# Patient Record
Sex: Female | Born: 1975 | Race: White | Hispanic: Yes | Marital: Single | Smoking: Former smoker
Health system: Southern US, Community
[De-identification: ages and names within clinical notes are randomized; demographics above are authoritative.]

## PROBLEM LIST (undated history)

## (undated) DIAGNOSIS — Z8619 Personal history of other infectious and parasitic diseases: Secondary | ICD-10-CM

## (undated) DIAGNOSIS — T7840XA Allergy, unspecified, initial encounter: Secondary | ICD-10-CM

## (undated) DIAGNOSIS — Z87440 Personal history of urinary (tract) infections: Secondary | ICD-10-CM

## (undated) DIAGNOSIS — M1990 Unspecified osteoarthritis, unspecified site: Secondary | ICD-10-CM

## (undated) HISTORY — PX: FRACTURE SURGERY: SHX138

## (undated) HISTORY — DX: Personal history of other infectious and parasitic diseases: Z86.19

## (undated) HISTORY — DX: Personal history of urinary (tract) infections: Z87.440

## (undated) HISTORY — DX: Unspecified osteoarthritis, unspecified site: M19.90

## (undated) HISTORY — DX: Allergy, unspecified, initial encounter: T78.40XA

## (undated) HISTORY — PX: EYE SURGERY: SHX253

---

## 1993-06-20 HISTORY — PX: CHOLECYSTECTOMY: SHX55

## 1999-12-01 ENCOUNTER — Other Ambulatory Visit: Admission: RE | Admit: 1999-12-01 | Discharge: 1999-12-01 | Payer: Self-pay | Admitting: Family Medicine

## 2000-12-29 ENCOUNTER — Other Ambulatory Visit: Admission: RE | Admit: 2000-12-29 | Discharge: 2000-12-29 | Payer: Self-pay | Admitting: Family Medicine

## 2003-05-09 ENCOUNTER — Other Ambulatory Visit: Admission: RE | Admit: 2003-05-09 | Discharge: 2003-05-09 | Payer: Self-pay | Admitting: Family Medicine

## 2004-08-18 ENCOUNTER — Ambulatory Visit: Payer: Self-pay | Admitting: Family Medicine

## 2007-03-24 ENCOUNTER — Emergency Department (HOSPITAL_COMMUNITY): Admission: EM | Admit: 2007-03-24 | Discharge: 2007-03-25 | Payer: Self-pay | Admitting: Emergency Medicine

## 2007-06-21 HISTORY — PX: RESECTION DISTAL CLAVICAL: SHX5053

## 2007-06-28 ENCOUNTER — Ambulatory Visit (HOSPITAL_BASED_OUTPATIENT_CLINIC_OR_DEPARTMENT_OTHER): Admission: RE | Admit: 2007-06-28 | Discharge: 2007-06-28 | Payer: Self-pay | Admitting: Orthopedic Surgery

## 2007-11-21 DIAGNOSIS — Z87891 Personal history of nicotine dependence: Secondary | ICD-10-CM | POA: Insufficient documentation

## 2007-11-22 ENCOUNTER — Ambulatory Visit: Payer: Self-pay | Admitting: Family Medicine

## 2007-11-22 DIAGNOSIS — J309 Allergic rhinitis, unspecified: Secondary | ICD-10-CM | POA: Insufficient documentation

## 2008-03-04 ENCOUNTER — Other Ambulatory Visit: Admission: RE | Admit: 2008-03-04 | Discharge: 2008-03-04 | Payer: Self-pay | Admitting: Family Medicine

## 2008-03-04 ENCOUNTER — Encounter: Payer: Self-pay | Admitting: Family Medicine

## 2008-03-04 ENCOUNTER — Ambulatory Visit: Payer: Self-pay | Admitting: Family Medicine

## 2008-03-04 DIAGNOSIS — B351 Tinea unguium: Secondary | ICD-10-CM | POA: Insufficient documentation

## 2008-03-05 LAB — CONVERTED CEMR LAB
ALT: 16 units/L (ref 0–35)
AST: 21 units/L (ref 0–37)
Albumin: 4.1 g/dL (ref 3.5–5.2)
Alkaline Phosphatase: 47 units/L (ref 39–117)
BUN: 15 mg/dL (ref 6–23)
Basophils Absolute: 0 10*3/uL (ref 0.0–0.1)
Basophils Relative: 0.8 % (ref 0.0–3.0)
Bilirubin, Direct: 0.1 mg/dL (ref 0.0–0.3)
CO2: 30 meq/L (ref 19–32)
Calcium: 9.2 mg/dL (ref 8.4–10.5)
Chloride: 107 meq/L (ref 96–112)
Cholesterol: 161 mg/dL (ref 0–200)
Creatinine, Ser: 0.7 mg/dL (ref 0.4–1.2)
Eosinophils Absolute: 0.2 10*3/uL (ref 0.0–0.7)
Eosinophils Relative: 3.9 % (ref 0.0–5.0)
GFR calc Af Amer: 123 mL/min
GFR calc non Af Amer: 102 mL/min
Glucose, Bld: 80 mg/dL (ref 70–99)
HCT: 37.9 % (ref 36.0–46.0)
HDL: 74.7 mg/dL (ref >39.0)
Hemoglobin: 13.3 g/dL (ref 12.0–15.0)
LDL Cholesterol: 72 mg/dL (ref 0–99)
Lymphocytes Relative: 39.2 % (ref 12.0–46.0)
MCHC: 35.1 g/dL (ref 30.0–36.0)
MCV: 91.2 fL (ref 78.0–100.0)
Monocytes Absolute: 0.6 10*3/uL (ref 0.1–1.0)
Monocytes Relative: 11.5 % (ref 3.0–12.0)
Neutro Abs: 2.3 10*3/uL (ref 1.4–7.7)
Neutrophils Relative %: 44.6 % (ref 43.0–77.0)
Platelets: 265 10*3/uL (ref 150–400)
Potassium: 4.2 meq/L (ref 3.5–5.1)
RBC: 4.15 M/uL (ref 3.87–5.11)
RDW: 12 % (ref 11.5–14.6)
Sodium: 139 meq/L (ref 135–145)
TSH: 1.6 microintl units/mL (ref 0.35–5.50)
Total Bilirubin: 0.8 mg/dL (ref 0.3–1.2)
Total CHOL/HDL Ratio: 2.2
Total Protein: 7.7 g/dL (ref 6.0–8.3)
Triglycerides: 71 mg/dL (ref 0–149)
VLDL: 14 mg/dL (ref 0–40)
WBC: 5.1 10*3/uL (ref 4.5–10.5)

## 2008-08-21 ENCOUNTER — Telehealth: Payer: Self-pay | Admitting: Family Medicine

## 2008-12-02 ENCOUNTER — Ambulatory Visit: Payer: Self-pay | Admitting: Family Medicine

## 2008-12-02 DIAGNOSIS — F411 Generalized anxiety disorder: Secondary | ICD-10-CM | POA: Insufficient documentation

## 2009-01-13 ENCOUNTER — Ambulatory Visit: Payer: Self-pay | Admitting: Family Medicine

## 2010-02-10 ENCOUNTER — Ambulatory Visit: Payer: Self-pay | Admitting: Family Medicine

## 2010-03-01 ENCOUNTER — Telehealth: Payer: Self-pay | Admitting: Family Medicine

## 2010-03-11 ENCOUNTER — Telehealth: Payer: Self-pay | Admitting: Family Medicine

## 2010-04-20 ENCOUNTER — Other Ambulatory Visit: Admission: RE | Admit: 2010-04-20 | Discharge: 2010-04-20 | Payer: Self-pay | Admitting: Family Medicine

## 2010-04-20 ENCOUNTER — Ambulatory Visit: Payer: Self-pay | Admitting: Family Medicine

## 2010-04-21 LAB — CONVERTED CEMR LAB
ALT: 15 units/L (ref 0–35)
AST: 19 units/L (ref 0–37)
Albumin: 4 g/dL (ref 3.5–5.2)
Alkaline Phosphatase: 48 units/L (ref 39–117)
BUN: 14 mg/dL (ref 6–23)
Basophils Absolute: 0 10*3/uL (ref 0.0–0.1)
Basophils Relative: 0.7 % (ref 0.0–3.0)
Bilirubin, Direct: 0.1 mg/dL (ref 0.0–0.3)
CO2: 28 meq/L (ref 19–32)
Calcium: 9.2 mg/dL (ref 8.4–10.5)
Chloride: 106 meq/L (ref 96–112)
Cholesterol: 139 mg/dL (ref 0–200)
Creatinine, Ser: 0.8 mg/dL (ref 0.4–1.2)
Eosinophils Absolute: 0.2 10*3/uL (ref 0.0–0.7)
Eosinophils Relative: 3.6 % (ref 0.0–5.0)
GFR calc non Af Amer: 82.32 mL/min (ref >60)
Glucose, Bld: 84 mg/dL (ref 70–99)
HCT: 39.2 % (ref 36.0–46.0)
HDL: 55 mg/dL (ref >39.00)
Hemoglobin: 13.7 g/dL (ref 12.0–15.0)
LDL Cholesterol: 72 mg/dL (ref 0–99)
Lymphocytes Relative: 31.7 % (ref 12.0–46.0)
Lymphs Abs: 1.9 10*3/uL (ref 0.7–4.0)
MCHC: 34.9 g/dL (ref 30.0–36.0)
MCV: 92.8 fL (ref 78.0–100.0)
Monocytes Absolute: 0.6 10*3/uL (ref 0.1–1.0)
Monocytes Relative: 10.7 % (ref 3.0–12.0)
Neutro Abs: 3.2 10*3/uL (ref 1.4–7.7)
Neutrophils Relative %: 53.3 % (ref 43.0–77.0)
Platelets: 261 10*3/uL (ref 150.0–400.0)
Potassium: 4.5 meq/L (ref 3.5–5.1)
RBC: 4.23 M/uL (ref 3.87–5.11)
RDW: 13.1 % (ref 11.5–14.6)
Sodium: 139 meq/L (ref 135–145)
TSH: 1.38 microintl units/mL (ref 0.35–5.50)
Total Bilirubin: 0.9 mg/dL (ref 0.3–1.2)
Total CHOL/HDL Ratio: 3
Total Protein: 7.1 g/dL (ref 6.0–8.3)
Triglycerides: 60 mg/dL (ref 0.0–149.0)
VLDL: 12 mg/dL (ref 0.0–40.0)
WBC: 6 10*3/uL (ref 4.5–10.5)

## 2010-04-28 LAB — CONVERTED CEMR LAB: Pap Smear: NEGATIVE

## 2010-07-20 NOTE — Progress Notes (Signed)
Summary: problems weaning off zoloft  Phone Note Call from Patient Call back at Home Phone [phone]   Caller: Patient Call For: Judith Part MD Summary of Call: Pt was told to wean off zoloft by taking one half pill a day for 2 weeks and then stop.  She says this is causing vertigo and she is asking if there is a slower way that she can wean off. Initial call taken by: Lowella Petties CMA,  March 01, 2010 4:27 PM  Follow-up for Phone Call        if she can cut the half "in half"-- take 3/4 pill for 2 weeks and then 1/2 for 2 weeks and then stop  try that and let me know if unable to split the pill that small I will px some 25 mgs Follow-up by: Judith Part MD,  March 01, 2010 4:45 PM  Additional Follow-up for Phone Call Additional follow up Details #1::        Left message for patient to call back on pt's cell. It is after 5pm and pt has already left work. Tried pt's cell again still no answer. Pt will call back on 03/02/10.Lewanda Rife LPN  March 01, 2010 5:06 PM   Patient notified as instructed by telephone. Pt said she could cut the pill in 1/2. Pt said she may run short on pills and if so she will have pharmacy call for refill.Lewanda Rife LPN  March 01, 2010 5:34 PM

## 2010-07-20 NOTE — Progress Notes (Signed)
Summary: wants to stay on zoloft for awhile  Phone Note Call from Patient   Caller: Patient Call For: Judith Part MD Summary of Call: Pt thinks that this is a bad time for her to try and stop zoloft.  She wants to continue to take it and try to stop again in about 6 months.  She will need script with refills sent to [facility]. Initial call taken by: Lowella Petties CMA,  March 11, 2010 11:00 AM  Follow-up for Phone Call        no problem px written on EMR for call in  Follow-up by: Judith Part MD,  March 11, 2010 12:47 PM  Additional Follow-up for Phone Call Additional follow up Details #1::        Medication phoned to [facility] [place] [facility] as instructed. Left message for patient to call back. Lewanda Rife LPN  March 11, 2010 2:48 PM   Advised pt that script has been called in. Additional Follow-up by: Lowella Petties CMA,  March 11, 2010 3:58 PM    New/Updated Medications: ZOLOFT 50 MG TABS (SERTRALINE HCL) 1 tablet by mouth once daily +Prescriptions: ZOLOFT 50 MG TABS (SERTRALINE HCL) 1 tablet by mouth once daily  #30 x 11   Entered and Authorized by:   Judith Part MD   Signed by:   Lewanda Rife LPN on 16/03/9603   Method used:   Telephoned to ...       [facility]  [address] [phone]* (retail)       [address]       [place], [place]  [address]       Ph: [phone] or [phone]       Fax: [phone]   RxID:   [id number]

## 2010-07-20 NOTE — Assessment & Plan Note (Signed)
Summary: DISCUSS ZOLOFT/CLE   Vital Signs:  Patient profile:   35 year old female Height:      70 inches Weight:      208.75 pounds BMI:     30.06 Temp:     97.9 degrees F oral Pulse rate:   64 / minute Pulse rhythm:   regular BP sitting:   116 / 72  (left arm) Cuff size:   regular  Vitals Entered By: Lewanda Rife LPN (February 10, 2010 3:22 PM) CC: discuss zoloft   History of Present Illness: here for f/u of anx/ dep  has been on zoloft  wt is up 18 lb   is doing well overall  has been on it for a year  things are leveled off at work   for a while things were really stressful-- and was anx and not sleeping  her father was very concerned  there have been some changes -- 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   on 50 of zoloft  not a lot of side effects on it  perhaps a little less energy   is eating healthy most of the time- worse at night  started back at the gym - more regularly 2-3 times per week  Allergies (verified): No Known Drug Allergies  Past History:  Past Medical History: Last updated: 11/22/2007 former smoker all rhinitis reactive airways- mild  Past Surgical History: Last updated: 03/04/2008 Cholecystectomy Colposcopy (05/2003) fx L collar bone - with surgery   Family History: Last updated: 03/04/2008 Father: skin cancer  Mother:  Siblings: 1 brother, 1 sister grandparent- skin cancer-- did spread-- ? liver (MGM) grandparent with MI   Social History: Last updated: 03/04/2008 Marital Status:single Children: none Occupation:  quit smoking jan 2001 alcohol occasionally -- at most a drink a day or just on the weekends   Risk Factors: Smoking Status: quit (11/21/2007)  Review of Systems General:  Denies fatigue, fever, loss of appetite, and malaise. Eyes:  Denies blurring and eye irritation. CV:  Denies chest pain or discomfort, lightheadness, and  palpitations. Resp:  Denies cough, shortness of breath, and wheezing. GI:  Denies nausea and vomiting. MS:  Denies joint pain. Derm:  Denies itching, lesion(s), poor wound healing, and rash. Neuro:  Denies headaches. Psych:  Denies anxiety and depression. Endo:  Complains of weight change; denies cold intolerance, excessive thirst, excessive urination, and heat intolerance. Heme:  Denies abnormal bruising and bleeding.  Physical Exam  General:  overweight but generally well appearing  Head:  normocephalic, atraumatic, and no abnormalities observed.   Eyes:  vision grossly intact, pupils equal, pupils round, and pupils reactive to light.   Mouth:  pharynx pink and moist.   Neck:  No deformities, masses, or tenderness noted. Lungs:  Normal respiratory effort, chest expands symmetrically. Lungs are clear to auscultation, no crackles or wheezes. Heart:  Normal rate and regular rhythm. S1 and S2 normal without gallop, murmur, click, rub or other extra sounds. Extremities:  No clubbing, cyanosis, edema, or deformity noted with normal full range of motion of all joints.   Neurologic:  sensation intact to light touch, gait normal, and DTRs symmetrical and normal.  no tremor  Skin:  Intact without suspicious lesions or rashes Cervical Nodes:  No lymphadenopathy noted Psych:  cheerful affect    Impression & Recommendations:  Problem # 1:  ANXIETY (ICD-300.00) Assessment Improved this is much imp  with zoloft and good coping skills/ change in job position disc plan to wean off of it -- take 1/2 pill daily for 2 weeks and then stop  update if problems or worsening symptoms rev coping skills in detail  Her updated medication list for this problem includes:    Zoloft 50 Mg Tabs (Sertraline hcl) .Marland Kitchen... 1 tablet by mouth once daily  Complete Medication List: 1)  Zoloft 50 Mg Tabs (Sertraline hcl) .Marland Kitchen.. 1 tablet by mouth once daily  Patient Instructions: 1)  cut zoloft down to 1/2 pill of zoloft  (total 25 mg dose)  2)  take that for 2 weeks and then stop it  3)  let me know if any problems   Current Allergies (reviewed today): No known allergies

## 2010-07-20 NOTE — Assessment & Plan Note (Signed)
Summary: CPX/CLE   Vital Signs:  Patient profile:   35 year old female Height:      70 inches Weight:      203.25 pounds BMI:     29.27 Temp:     98 degrees F oral Pulse rate:   64 / minute Pulse rhythm:   regular BP sitting:   118 / 70  (left arm) Cuff size:   regular  Vitals Entered By: Lewanda Rife LPN (April 20, 2010 9:47 AM) CC: CPX with pap and breast exam LMP 03/29/10   History of Present Illness: here for wellness exam and also to disc chronic med problems  is feeling good   wt is down 5 lb -- has been better with her diet but not exercising  has been putting all her craft fairs lately    bp good 118/70  needs pap--  no menses problems - is regular -- but more cramps off of OC  is tolerating it ok -- just bad one day does not need birth control  nl in 09 colpo in 04-- all neg since then   Td 2010 no flu shots   tried to go off zoloft -- did not go well got vertigo really bad  then had major stress at work   wants wellness labs tody  has fungal toenails -- and not painful so does not want tx yet lost L first nail and that made it better    Allergies (verified): No Known Drug Allergies  Past History:  Past Surgical History: Last updated: 03/04/2008 Cholecystectomy Colposcopy (05/2003) fx L collar bone - with surgery   Family History: Last updated: 03/04/2008 Father: skin cancer  Mother:  Siblings: 1 brother, 1 sister grandparent- skin cancer-- did spread-- ? liver (MGM) grandparent with MI   Social History: Last updated: 03/04/2008 Marital Status:single Children: none Occupation:  quit smoking jan 2001 alcohol occasionally -- at most a drink a day or just on the weekends   Risk Factors: Smoking Status: quit (11/21/2007)  Past Medical History: former smoker all rhinitis reactive airways- mild onchomycosis  anxiety  Review of Systems General:  Denies fatigue, loss of appetite, and malaise. Eyes:  Denies blurring and eye  irritation. CV:  Denies chest pain or discomfort, palpitations, shortness of breath with exertion, and swelling of feet. Resp:  Denies shortness of breath. GI:  Denies abdominal pain, change in bowel habits, hemorrhoids, and nausea. GU:  Denies abnormal vaginal bleeding, discharge, dysuria, and urinary frequency. MS:  Denies joint pain, joint redness, joint swelling, and stiffness. Derm:  Denies itching, lesion(s), poor wound healing, and rash. Neuro:  Denies headaches, numbness, and tingling. Psych:  Complains of anxiety; denies panic attacks, sense of great danger, and suicidal thoughts/plans. Endo:  Denies cold intolerance, excessive thirst, excessive urination, and heat intolerance. Heme:  Denies abnormal bruising and bleeding.  Physical Exam  General:  overweight but generally well appearing  Head:  normocephalic, atraumatic, and no abnormalities observed.   Eyes:  vision grossly intact, pupils equal, pupils round, and pupils reactive to light.  no conjunctival pallor, injection or icterus  Ears:  R ear normal and L ear normal.   Nose:  no nasal discharge.   Mouth:  pharynx pink and moist.   Neck:  supple with full rom and no masses or thyromegally, no JVD or carotid bruit  Chest Wall:  No deformities, masses, or tenderness noted. Breasts:  No mass, nodules, thickening, tenderness, bulging, retraction, inflamation, nipple discharge or skin  changes noted.   Lungs:  Normal respiratory effort, chest expands symmetrically. Lungs are clear to auscultation, no crackles or wheezes. Heart:  Normal rate and regular rhythm. S1 and S2 normal without gallop, murmur, click, rub or other extra sounds. Abdomen:  Bowel sounds positive,abdomen soft and non-tender without masses, organomegaly or hernias noted. no renal bruits  Genitalia:  Normal introitus for age, no external lesions, no vaginal discharge, mucosa pink and moist, no vaginal or cervical lesions, no vaginal atrophy, no friaility or  hemorrhage, normal uterus size and position, no adnexal masses or tenderness Msk:  No deformity or scoliosis noted of thoracic or lumbar spine.   no acute joint changes  Pulses:  R and L carotid,radial,femoral,dorsalis pedis and posterior tibial pulses are full and equal bilaterally Extremities:  No clubbing, cyanosis, edema, or deformity noted with normal full range of motion of all joints.   Neurologic:  sensation intact to light touch, gait normal, and DTRs symmetrical and normal.   Skin:  R 1st toenail is slt yellow and thickened L 1st toenail is growing back  Cervical Nodes:  No lymphadenopathy noted Axillary Nodes:  No palpable lymphadenopathy Inguinal Nodes:  No significant adenopathy Psych:  normal affect, talkative and pleasant    Impression & Recommendations:  Problem # 1:  HEALTH MAINTENANCE EXAM (ICD-V70.0) Assessment Comment Only reviewed health habits including diet, exercise and skin cancer prevention reviewed health maintenance list and family history disc plan to get back to exercise  lab today Orders: Venipuncture (04540) TLB-Lipid Panel (80061-LIPID) TLB-BMP (Basic Metabolic Panel-BMET) (80048-METABOL) TLB-CBC Platelet - w/Differential (85025-CBCD) TLB-Hepatic/Liver Function Pnl (80076-HEPATIC) TLB-TSH (Thyroid Stimulating Hormone) (84443-TSH)  Problem # 2:  ROUTINE GYNECOLOGICAL EXAMINATION (ICD-V72.31) Assessment: Comment Only annual exam with pap remote hx of colp in past  no menses problems  Problem # 3:  ANXIETY (ICD-300.00) Assessment: Improved doing well- will continue zoloft Her updated medication list for this problem includes:    Zoloft 50 Mg Tabs (Sertraline hcl) .Marland Kitchen... 1 tablet by mouth once daily  Problem # 4:  ONYCHOMYCOSIS (ICD-110.1) for now will observe since not symptomatic   Complete Medication List: 1)  Zoloft 50 Mg Tabs (Sertraline hcl) .Marland Kitchen.. 1 tablet by mouth once daily  Patient Instructions: 1)  labs today 2)  work on getting  back to exercise program  3)  call your insurance co to see if they pay for a baseline mammogram and let me know - I will schedule it    Orders Added: 1)  Venipuncture [36415] 2)  TLB-Lipid Panel [80061-LIPID] 3)  TLB-BMP (Basic Metabolic Panel-BMET) [80048-METABOL] 4)  TLB-CBC Platelet - w/Differential [85025-CBCD] 5)  TLB-Hepatic/Liver Function Pnl [80076-HEPATIC] 6)  TLB-TSH (Thyroid Stimulating Hormone) [84443-TSH] 7)  Est. Patient 18-39 years [99395]    Current Allergies (reviewed today): No known allergies

## 2010-11-02 NOTE — Op Note (Signed)
Angela Gates, NORED              ACCOUNT NO.:  [id number]   MEDICAL RECORD NO.:  [id number]          PATIENT TYPE:  AMB   LOCATION:  [facility]                          FACILITY:  [facility]   PHYSICIAN:  Rodney A. Mortenson, M.D.DATE OF BIRTH:  11/27/1975   DATE OF PROCEDURE:  06/28/2007  DATE OF DISCHARGE:                               OPERATIVE REPORT   JUSTIFICATION:  A 35 year old female who sustained a fracture of her  left clavicle and had a clavicle pin inserted.  She now returns to have  the clavicle pin removed.  Questions answered and encouraged  preoperatively.  Complications discussed.   JUSTIFICATION FOR OUTPATIENT SURGERY:  Minimal morbidity.   PREOPERATIVE DIAGNOSIS:  Painful pin left clavicle.   POSTOPERATIVE DIAGNOSIS:  Painful pin left clavicle.   OPERATION:  Removal intramedullary pin left clavicle.   SURGEON:  Lenard Galloway. Chaney Malling, M.D.   ANESTHESIA:  General.   PROCEDURE:  The patient was placed on the operating table in the supine  position.  After satisfactory general anesthesia, the patient was placed  in the semi-sitting position.  The entire left upper extremity and  shoulder was then prepped with DuraPrep and draped out in the usual  manner.  The clavicle pin was sticking out the posterior aspect of the  clavicle and in the usual position.  This could be felt in the  subcutaneous area.  An incision was made over the pin.  Dissection was  carried down to the pin.  A T-handled wrench was then placed over the  pin and over the hex-head screw, and the pin was then extracted very  nicely.  The wound was debrided in this area.  This went extremely well.  The skin was closed with running 3-0 nylon suture.  Sterile dressings  were applied.  The patient returned to t recovery room in excellent  condition.   DISPOSITION:  1. Vicodin for pain.  2. Return to my office next week for followup exam.      [name] A. Chaney Malling, M.D.  Electronically Signed     RAM/MEDQ  D:  06/28/2007  T:  06/28/2007  Job:  [id number]

## 2013-05-28 ENCOUNTER — Ambulatory Visit (INDEPENDENT_AMBULATORY_CARE_PROVIDER_SITE_OTHER): Payer: No Typology Code available for payment source | Admitting: Family Medicine

## 2013-05-28 ENCOUNTER — Encounter: Payer: Self-pay | Admitting: Family Medicine

## 2013-05-28 ENCOUNTER — Other Ambulatory Visit (HOSPITAL_COMMUNITY)
Admission: RE | Admit: 2013-05-28 | Discharge: 2013-05-28 | Disposition: A | Discharge status: Home / Self Care | Payer: No Typology Code available for payment source | Source: Ambulatory Visit | Attending: Family Medicine | Admitting: Family Medicine

## 2013-05-28 VITALS — BP 124/82 | HR 61 | Temp 98.2°F | Ht 69.75 in | Wt 193.8 lb

## 2013-05-28 DIAGNOSIS — Z1151 Encounter for screening for human papillomavirus (HPV): Secondary | ICD-10-CM | POA: Insufficient documentation

## 2013-05-28 DIAGNOSIS — Z01419 Encounter for gynecological examination (general) (routine) without abnormal findings: Secondary | ICD-10-CM | POA: Insufficient documentation

## 2013-05-28 DIAGNOSIS — Z0000 Encounter for general adult medical examination without abnormal findings: Secondary | ICD-10-CM

## 2013-05-28 DIAGNOSIS — Z23 Encounter for immunization: Secondary | ICD-10-CM

## 2013-05-28 NOTE — Progress Notes (Signed)
Pre-visit discussion using our clinic review tool. No additional management support is needed unless otherwise documented below in the visit note.  

## 2013-05-28 NOTE — Patient Instructions (Signed)
Pap today  Labs today Keep working on weight loss  Think about increasing exercise to 5 days per week Flu vaccine today

## 2013-05-28 NOTE — Progress Notes (Signed)
Subjective:    Patient ID: Angela Gates, female    DOB: 1976/04/18, 37 y.o.   MRN: [id number]  HPI Here to get re established  She forgets to get in when she needs too   Has been doing well   Recently dropped 32 lb and then gained some back Net loss from last visit 10 lb  Has always exercised regularly and a healthy eater   When she lost wt she gave up beer -and that helped   She is cutting way back on beer again - 2 per week maximum   Is no longer anxiety eating - and that is great  Her anxiety is much better than it used to be  She crochets all the time - and really enjoys that   Not smoking -happy about that  Td 2010  Has not had a flu shot   Needs pap and gyn    Wants blood work and health mt exam today   Patient Active Problem List   Diagnosis Date Noted  . ANXIETY 12/02/2008  . ONYCHOMYCOSIS 03/04/2008  . ALLERGIC RHINITIS 11/22/2007  . TOBACCO USE, QUIT 11/21/2007   Past Medical History  Diagnosis Date  . History of chicken pox   . History of UTI    Past Surgical History  Procedure Laterality Date  . Cholecystectomy  1995  . Resection distal clavical  2009   History  Substance Use Topics  . Smoking status: Former Games developer  . Smokeless tobacco: Not on file     Comment: smoked in college  . Alcohol Use: No     Comment: occ-few beers a week   Family History  Problem Relation Age of Onset  . Colon cancer Paternal Aunt   . Skin cancer Paternal Grandfather   . Skin cancer Paternal Grandmother   . Skin cancer Father   . Prostate cancer Maternal Grandfather   . Prostate cancer Maternal Uncle   . High blood pressure Father   . High blood pressure Paternal Grandmother   . Depression Sister    No Known Allergies No current outpatient prescriptions on file prior to visit.   No current facility-administered medications on file prior to visit.      Review of Systems Review of Systems  Constitutional: Negative for fever, appetite change, fatigue  and unexpected weight change.  Eyes: Negative for pain and visual disturbance.  Respiratory: Negative for cough and shortness of breath.   Cardiovascular: Negative for cp or palpitations    Gastrointestinal: Negative for nausea, diarrhea and constipation.  Genitourinary: Negative for urgency and frequency.  Skin: Negative for pallor or rash   Neurological: Negative for weakness, light-headedness, numbness and headaches.  Hematological: Negative for adenopathy. Does not bruise/bleed easily.  Psychiatric/Behavioral: Negative for dysphoric mood. The patient is not nervous/anxious.         Objective:   Physical Exam  Constitutional: She appears well-developed and well-nourished. No distress.  HENT:  Head: Normocephalic and atraumatic.  Right Ear: External ear normal.  Left Ear: External ear normal.  Nose: Nose normal.  Mouth/Throat: Oropharynx is clear and moist.  Eyes: Conjunctivae and EOM are normal. Pupils are equal, round, and reactive to light. Right eye exhibits no discharge. Left eye exhibits no discharge. No scleral icterus.  Neck: Normal range of motion. Neck supple. No JVD present. No thyromegaly present.  Cardiovascular: Normal rate, regular rhythm, normal heart sounds and intact distal pulses.  Exam reveals no gallop.   Pulmonary/Chest: Effort normal and breath sounds  normal. No respiratory distress. She has no wheezes. She has no rales.  Abdominal: Soft. Bowel sounds are normal. She exhibits no distension and no mass. There is no tenderness.  Genitourinary: Vagina normal and uterus normal. No breast swelling, tenderness, discharge or bleeding. There is no rash, tenderness or lesion on the right labia. There is no rash, tenderness or lesion on the left labia. Uterus is not enlarged and not tender. Cervix exhibits no motion tenderness and no discharge. Right adnexum displays no mass, no tenderness and no fullness. Left adnexum displays no tenderness and no fullness. No bleeding  around the vagina. No vaginal discharge found.  Breast exam: No mass, nodules, thickening, tenderness, bulging, retraction, inflamation, nipple discharge or skin changes noted.  No axillary or clavicular LA.  Chaperoned exam.    Musculoskeletal: She exhibits no edema and no tenderness.  Lymphadenopathy:    She has no cervical adenopathy.  Neurological: She is alert. She has normal reflexes. No cranial nerve deficit. She exhibits normal muscle tone. Coordination normal.  Skin: Skin is warm and dry. No rash noted. No erythema. No pallor.  lentigos diffusely   Psychiatric: She has a normal mood and affect.          Assessment & Plan:

## 2013-05-29 LAB — COMPREHENSIVE METABOLIC PANEL
ALT: 17 U/L (ref 0–35)
AST: 18 U/L (ref 0–37)
Albumin: 4.1 g/dL (ref 3.5–5.2)
Alkaline Phosphatase: 54 U/L (ref 39–117)
BUN: 9 mg/dL (ref 6–23)
CO2: 25 mEq/L (ref 19–32)
Calcium: 9 mg/dL (ref 8.4–10.5)
Chloride: 104 mEq/L (ref 96–112)
Creatinine, Ser: 0.8 mg/dL (ref 0.4–1.2)
GFR: 83.28 mL/min (ref >60.00)
Glucose, Bld: 85 mg/dL (ref 70–99)
Potassium: 3.8 mEq/L (ref 3.5–5.1)
Sodium: 138 mEq/L (ref 135–145)
Total Bilirubin: 0.6 mg/dL (ref 0.3–1.2)
Total Protein: 7.8 g/dL (ref 6.0–8.3)

## 2013-05-29 LAB — CBC WITH DIFFERENTIAL/PLATELET
Basophils Absolute: 0 10*3/uL (ref 0.0–0.1)
Basophils Relative: 0.5 % (ref 0.0–3.0)
Eosinophils Absolute: 0.3 10*3/uL (ref 0.0–0.7)
Eosinophils Relative: 4.5 % (ref 0.0–5.0)
HCT: 37.9 % (ref 36.0–46.0)
Hemoglobin: 12.9 g/dL (ref 12.0–15.0)
Lymphocytes Relative: 35.8 % (ref 12.0–46.0)
Lymphs Abs: 2.4 10*3/uL (ref 0.7–4.0)
MCV: 88.8 fl (ref 78.0–100.0)
Monocytes Absolute: 0.7 10*3/uL (ref 0.1–1.0)
Monocytes Relative: 9.6 % (ref 3.0–12.0)
Neutrophils Relative %: 49.6 % (ref 43.0–77.0)
Platelets: 263 10*3/uL (ref 150.0–400.0)
RBC: 4.27 Mil/uL (ref 3.87–5.11)
WBC: 6.8 10*3/uL (ref 4.5–10.5)

## 2013-05-29 LAB — LIPID PANEL
Cholesterol: 145 mg/dL (ref 0–200)
HDL: 58.2 mg/dL (ref >39.00)
LDL Cholesterol: 77 mg/dL (ref 0–99)
Total CHOL/HDL Ratio: 2
Triglycerides: 47 mg/dL (ref 0.0–149.0)
VLDL: 9.4 mg/dL (ref 0.0–40.0)

## 2013-05-29 NOTE — Assessment & Plan Note (Signed)
Exam with pap done  Pt denies need for contraception   No complaints

## 2013-05-29 NOTE — Assessment & Plan Note (Signed)
Reviewed health habits including diet and exercise and skin cancer prevention Reviewed appropriate screening tests for age  Also reviewed health mt list, fam hx and immunization status , as well as social and family history   See HPI Wellness labs today Enc further wt loss

## 2013-06-05 ENCOUNTER — Ambulatory Visit: Payer: Self-pay | Admitting: Family Medicine

## 2014-04-04 ENCOUNTER — Other Ambulatory Visit: Payer: Self-pay

## 2014-05-25 ENCOUNTER — Telehealth: Payer: Self-pay | Admitting: Family Medicine

## 2014-05-25 DIAGNOSIS — Z0000 Encounter for general adult medical examination without abnormal findings: Secondary | ICD-10-CM

## 2014-05-25 NOTE — Telephone Encounter (Signed)
-----   Message from [place] sent at 05/19/2014  3:35 PM EST ----- Regarding: Cpx labs Mon 05/26/14, need orders. Please order  future cpx labs for pt's upcoming lab appt. Thanks Aniceto Boss

## 2014-05-26 ENCOUNTER — Other Ambulatory Visit (INDEPENDENT_AMBULATORY_CARE_PROVIDER_SITE_OTHER): Payer: No Typology Code available for payment source

## 2014-05-26 DIAGNOSIS — Z0000 Encounter for general adult medical examination without abnormal findings: Secondary | ICD-10-CM

## 2014-05-26 LAB — COMPREHENSIVE METABOLIC PANEL
ALT: 16 U/L (ref 0–35)
AST: 20 U/L (ref 0–37)
Albumin: 3.8 g/dL (ref 3.5–5.2)
Alkaline Phosphatase: 48 U/L (ref 39–117)
BUN: 13 mg/dL (ref 6–23)
CO2: 26 mEq/L (ref 19–32)
Calcium: 8.9 mg/dL (ref 8.4–10.5)
Chloride: 107 mEq/L (ref 96–112)
Creatinine, Ser: 0.7 mg/dL (ref 0.4–1.2)
GFR: 101.41 mL/min (ref >60.00)
Glucose, Bld: 95 mg/dL (ref 70–99)
Potassium: 4.3 mEq/L (ref 3.5–5.1)
Sodium: 138 mEq/L (ref 135–145)
Total Bilirubin: 0.5 mg/dL (ref 0.2–1.2)
Total Protein: 6.9 g/dL (ref 6.0–8.3)

## 2014-05-26 LAB — CBC WITH DIFFERENTIAL/PLATELET
Basophils Absolute: 0.1 10*3/uL (ref 0.0–0.1)
Basophils Relative: 0.9 % (ref 0.0–3.0)
Eosinophils Absolute: 0.6 10*3/uL (ref 0.0–0.7)
Eosinophils Relative: 7.1 % — ABNORMAL HIGH (ref 0.0–5.0)
HCT: 38.6 % (ref 36.0–46.0)
Hemoglobin: 12.8 g/dL (ref 12.0–15.0)
Lymphocytes Relative: 19.2 % (ref 12.0–46.0)
Lymphs Abs: 1.5 10*3/uL (ref 0.7–4.0)
MCHC: 33.2 g/dL (ref 30.0–36.0)
MCV: 92.2 fl (ref 78.0–100.0)
Monocytes Absolute: 0.6 10*3/uL (ref 0.1–1.0)
Monocytes Relative: 8 % (ref 3.0–12.0)
Neutro Abs: 5.2 10*3/uL (ref 1.4–7.7)
Neutrophils Relative %: 64.8 % (ref 43.0–77.0)
Platelets: 247 10*3/uL (ref 150.0–400.0)
RBC: 4.19 Mil/uL (ref 3.87–5.11)
RDW: 12.9 % (ref 11.5–15.5)
WBC: 8.1 10*3/uL (ref 4.0–10.5)

## 2014-05-26 LAB — LIPID PANEL
Cholesterol: 112 mg/dL (ref 0–200)
HDL: 52.1 mg/dL (ref >39.00)
LDL Cholesterol: 54 mg/dL (ref 0–99)
NonHDL: 59.9
Total CHOL/HDL Ratio: 2
Triglycerides: 29 mg/dL (ref 0.0–149.0)
VLDL: 5.8 mg/dL (ref 0.0–40.0)

## 2014-05-26 LAB — TSH: TSH: 1.82 u[IU]/mL (ref 0.35–4.50)

## 2014-06-02 ENCOUNTER — Other Ambulatory Visit (HOSPITAL_COMMUNITY)
Admission: RE | Admit: 2014-06-02 | Discharge: 2014-06-02 | Disposition: A | Discharge status: Home / Self Care | Payer: No Typology Code available for payment source | Source: Ambulatory Visit | Attending: Family Medicine | Admitting: Family Medicine

## 2014-06-02 ENCOUNTER — Ambulatory Visit (INDEPENDENT_AMBULATORY_CARE_PROVIDER_SITE_OTHER): Payer: No Typology Code available for payment source | Admitting: Family Medicine

## 2014-06-02 ENCOUNTER — Encounter: Payer: Self-pay | Admitting: Family Medicine

## 2014-06-02 VITALS — BP 110/66 | HR 69 | Temp 98.1°F | Ht 69.75 in | Wt 163.0 lb

## 2014-06-02 DIAGNOSIS — Z1151 Encounter for screening for human papillomavirus (HPV): Secondary | ICD-10-CM | POA: Diagnosis present

## 2014-06-02 DIAGNOSIS — Z01419 Encounter for gynecological examination (general) (routine) without abnormal findings: Secondary | ICD-10-CM

## 2014-06-02 DIAGNOSIS — Z01411 Encounter for gynecological examination (general) (routine) with abnormal findings: Secondary | ICD-10-CM | POA: Insufficient documentation

## 2014-06-02 DIAGNOSIS — Z1231 Encounter for screening mammogram for malignant neoplasm of breast: Secondary | ICD-10-CM

## 2014-06-02 DIAGNOSIS — Z0000 Encounter for general adult medical examination without abnormal findings: Secondary | ICD-10-CM

## 2014-06-02 DIAGNOSIS — Z23 Encounter for immunization: Secondary | ICD-10-CM

## 2014-06-02 NOTE — Assessment & Plan Note (Signed)
Annual exam with pap No complaints   No need for contraception

## 2014-06-02 NOTE — Progress Notes (Signed)
Pre visit review using our clinic review tool, if applicable. No additional management support is needed unless otherwise documented below in the visit note. 

## 2014-06-02 NOTE — Assessment & Plan Note (Signed)
Reviewed health habits including diet and exercise and skin cancer prevention Reviewed appropriate screening tests for age  Also reviewed health mt list, fam hx and immunization status , as well as social and family history    Labs reviewed  Gyn exam done  Flu shot today

## 2014-06-02 NOTE — Assessment & Plan Note (Signed)
Scheduled annual screening mammogram Nl breast exam today  Encouraged monthly self exams   

## 2014-06-02 NOTE — Patient Instructions (Signed)
Stop up front for mammogram referral  Labs are ok  Take care of yourself  Flu shot today

## 2014-06-02 NOTE — Progress Notes (Signed)
Subjective:    Patient ID: Angela Gates, female    DOB: [birth year], 38 y.o.   MRN: [id number]  HPI Here for health maintenance exam and to review chronic medical problems   Is doing very well   Wt is down 30 lb from last visit a year ago- she met her goal  Has been working on it  Now exercises a lot - kayaking over the summer - then rowing boat  Walks a lot  Eats healthy- still avoiding beer  bmi is 23  Flu shot - she will get one today  Pap nl 12/14 No period problems  Not sexually active  Td 7/10   Results for orders placed or performed in visit on 05/26/14  CBC with Differential  Result Value Ref Range   WBC 8.1 4.0 - 10.5 K/uL   RBC 4.19 3.87 - 5.11 Mil/uL   Hemoglobin 12.8 12.0 - 15.0 g/dL   HCT 38.6 36.0 - 46.0 %   MCV 92.2 78.0 - 100.0 fl   MCHC 33.2 30.0 - 36.0 g/dL   RDW 12.9 11.5 - 15.5 %   Platelets 247.0 150.0 - 400.0 K/uL   Neutrophils Relative % 64.8 43.0 - 77.0 %   Lymphocytes Relative 19.2 12.0 - 46.0 %   Monocytes Relative 8.0 3.0 - 12.0 %   Eosinophils Relative 7.1 (H) 0.0 - 5.0 %   Basophils Relative 0.9 0.0 - 3.0 %   Neutro Abs 5.2 1.4 - 7.7 K/uL   Lymphs Abs 1.5 0.7 - 4.0 K/uL   Monocytes Absolute 0.6 0.1 - 1.0 K/uL   Eosinophils Absolute 0.6 0.0 - 0.7 K/uL   Basophils Absolute 0.1 0.0 - 0.1 K/uL  Comprehensive metabolic panel  Result Value Ref Range   Sodium 138 135 - 145 mEq/L   Potassium 4.3 3.5 - 5.1 mEq/L   Chloride 107 96 - 112 mEq/L   CO2 26 19 - 32 mEq/L   Glucose, Bld 95 70 - 99 mg/dL   BUN 13 6 - 23 mg/dL   Creatinine, Ser 0.7 0.4 - 1.2 mg/dL   Total Bilirubin 0.5 0.2 - 1.2 mg/dL   Alkaline Phosphatase 48 39 - 117 U/L   AST 20 0 - 37 U/L   ALT 16 0 - 35 U/L   Total Protein 6.9 6.0 - 8.3 g/dL   Albumin 3.8 3.5 - 5.2 g/dL   Calcium 8.9 8.4 - 10.5 mg/dL   GFR 101.41 >60.00 mL/min  Lipid panel  Result Value Ref Range   Cholesterol 112 0 - 200 mg/dL   Triglycerides 29.0 0.0 - 149.0 mg/dL   HDL 52.10 >39.00 mg/dL   VLDL 5.8  0.0 - 40.0 mg/dL   LDL Cholesterol 54 0 - 99 mg/dL   Total CHOL/HDL Ratio 2    NonHDL 59.90   TSH  Result Value Ref Range   TSH 1.82 0.35 - 4.50 uIU/mL      Chemistry       Great cholesterol profile  Review of Systems Review of Systems  Constitutional: Negative for fever, appetite change, fatigue and unexpected weight change.  Eyes: Negative for pain and visual disturbance.  Respiratory: Negative for cough and shortness of breath.   Cardiovascular: Negative for cp or palpitations    Gastrointestinal: Negative for nausea, diarrhea and constipation.  Genitourinary: Negative for urgency and frequency.  Skin: Negative for pallor or rash   Neurological: Negative for weakness, light-headedness, numbness and headaches.  Hematological: Negative for adenopathy. Does not bruise/bleed easily.  Psychiatric/Behavioral: Negative for dysphoric mood. The patient is not nervous/anxious.         Objective:   Physical Exam  Constitutional: She appears well-developed and well-nourished. No distress.  HENT:  Head: Normocephalic and atraumatic.  Right Ear: External ear normal.  Left Ear: External ear normal.  Mouth/Throat: Oropharynx is clear and moist.  Eyes: Conjunctivae and EOM are normal. Pupils are equal, round, and reactive to light. No scleral icterus.  Neck: Normal range of motion. Neck supple. No JVD present. Carotid bruit is not present. No thyromegaly present.  Cardiovascular: Normal rate, regular rhythm, normal heart sounds and intact distal pulses.  Exam reveals no gallop.   Pulmonary/Chest: Effort normal and breath sounds normal. No respiratory distress. She has no wheezes. She exhibits no tenderness.  Abdominal: Soft. Bowel sounds are normal. She exhibits no distension, no abdominal bruit and no mass. There is no tenderness.  Genitourinary: Vagina normal and uterus normal. No breast swelling, tenderness, discharge or bleeding. There is no rash, tenderness or lesion on the right  labia. There is no rash, tenderness or lesion on the left labia. Uterus is not enlarged and not tender. Cervix exhibits no motion tenderness, no discharge and no friability. Right adnexum displays no mass, no tenderness and no fullness. Left adnexum displays no mass, no tenderness and no fullness. No erythema or bleeding in the vagina. No vaginal discharge found.  Breast exam: No mass, nodules, thickening, tenderness, bulging, retraction, inflamation, nipple discharge or skin changes noted.  No axillary or clavicular LA.      Musculoskeletal: Normal range of motion. She exhibits no edema or tenderness.  Lymphadenopathy:    She has no cervical adenopathy.  Neurological: She is alert. She has normal reflexes. No cranial nerve deficit. She exhibits normal muscle tone. Coordination normal.  Skin: Skin is warm and dry. No rash noted. No erythema. No pallor.  Psychiatric: She has a normal mood and affect.          Assessment & Plan:   Problem List Items Addressed This Visit      Other   Encounter for routine gynecological examination - Primary    Annual exam with pap No complaints   No need for contraception     Encounter for screening mammogram for breast cancer    Scheduled annual screening mammogram Nl breast exam today  Encouraged monthly self exams      Relevant Orders      MM DIGITAL SCREENING BILATERAL   Routine general medical examination at a health care facility    Reviewed health habits including diet and exercise and skin cancer prevention Reviewed appropriate screening tests for age  Also reviewed health mt list, fam hx and immunization status , as well as social and family history    Labs reviewed  Gyn exam done  Flu shot today     Relevant Orders      Cytology - PAP    Other Visit Diagnoses    Need for prophylactic vaccination and inoculation against influenza        Relevant Orders       Flu Vaccine QUAD 36+ mos PF IM (Fluarix Quad PF) (Completed)

## 2014-06-03 LAB — CYTOLOGY - PAP

## 2014-06-05 ENCOUNTER — Ambulatory Visit
Admission: RE | Admit: 2014-06-05 | Discharge: 2014-06-05 | Disposition: A | Discharge status: Home / Self Care | Payer: No Typology Code available for payment source | Source: Ambulatory Visit | Attending: Family Medicine | Admitting: Family Medicine

## 2014-06-05 DIAGNOSIS — Z1231 Encounter for screening mammogram for malignant neoplasm of breast: Secondary | ICD-10-CM

## 2014-10-13 ENCOUNTER — Ambulatory Visit (INDEPENDENT_AMBULATORY_CARE_PROVIDER_SITE_OTHER): Payer: 59 | Admitting: Primary Care

## 2014-10-13 ENCOUNTER — Encounter: Payer: Self-pay | Admitting: Primary Care

## 2014-10-13 VITALS — BP 104/64 | HR 60 | Temp 97.9°F | Ht 70.0 in | Wt 173.5 lb

## 2014-10-13 DIAGNOSIS — W57XXXA Bitten or stung by nonvenomous insect and other nonvenomous arthropods, initial encounter: Secondary | ICD-10-CM

## 2014-10-13 DIAGNOSIS — T148 Other injury of unspecified body region: Secondary | ICD-10-CM

## 2014-10-13 MED ORDER — DOXYCYCLINE HYCLATE 100 MG PO TABS: 200.0000 mg | ORAL_TABLET | Freq: Once | ORAL | Status: DC

## 2014-10-13 NOTE — Patient Instructions (Signed)
Take both tablets of the Doxycycline today for prophylaxis of Lyme's disease. Continue to monitor your body for rash, joint aches, fevers, fatigue. Let me know if any of these symptoms develop. Use insect repellant when outdoors to help prevent future tick bites. It was a pleasure meeting you! Take care! Lyme Disease You may have been bitten by a tick and are to watch for the development of Lyme Disease. Lyme Disease is an infection that is caused by a bacteria The bacteria causing this disease is named Borreilia burgdorferi. If a tick is infected with this bacteria and then bites you, then Lyme Disease may occur. These ticks are carried by deer and rodents such as rabbits and mice and infest grassy as well as forested areas. Fortunately most tick bites do not cause Lyme Disease.  Lyme Disease is easier to prevent than to treat. First, covering your legs with clothing when walking in areas where ticks are possibly abundant will prevent their attachment because ticks tend to stay within inches of the ground. Second, using insecticides containing DEET can be applied on skin or clothing. Last, because it takes about 12 to 24 hours for the tick to transmit the disease after attachment to the human host, you should inspect your body for ticks twice a day when you are in areas where Lyme Disease is common. You must look thoroughly when searching for ticks. The Ixodes tick that carries Lyme Disease is very small. It is around the size of a sesame seed (picture of tick is not actual size). Removal is best done by grasping the tick by the head and pulling it out. Do not to squeeze the body of the tick. This could inject the infecting bacteria into the bite site. Wash the area of the bite with an antiseptic solution after removal.  Lyme Disease is a disease that may affect many body systems. Because of the small size of the biting tick, most people do not notice being bitten. The first sign of an infection is usually  a round red rash that extends out from the center of the tick bite. The center of the lesion may be blood colored (hemorrhagic) or have tiny blisters (vesicular). Most lesions have bright red outer borders and partial central clearing. This rash may extend out many inches in diameter, and multiple lesions may be present. Other symptoms such as fatigue, headaches, chills and fever, general achiness and swelling of lymph glands may also occur. If this first stage of the disease is left untreated, these symptoms may gradually resolve by themselves, or progressive symptoms may occur because of spread of infection to other areas of the body.  Follow up with your caregiver to have testing and treatment if you have a tick bite and you develop any of the above complaints. Your caregiver may recommend preventative (prophylactic) medications which kill bacteria (antibiotics). Once a diagnosis of Lyme Disease is made, antibiotic treatment is highly likely to cure the disease. Effective treatment of late stage Lyme Disease may require longer courses of antibiotic therapy.  MAKE SURE YOU:   Understand these instructions.  Will watch your condition.  Will get help right away if you are not doing well or get worse. Document Released: 09/12/2000 Document Revised: 08/29/2011 Document Reviewed: 11/14/2008 [facility] Patient Information 2015 [place], [place]. This information is not intended to replace advice given to you by your health care provider. Make sure you discuss any questions you have with your health care provider.

## 2014-10-13 NOTE — Progress Notes (Signed)
Subjective:    Patient ID: Angela Gates, female    DOB: 03-23-1976, 39 y.o.   MRN: [id number]  HPI  Ms. Bisig is a 39 year old female who presents today with a chief complaint of fatigue, loss of energy, body aches, and 2 headaches for the past 7 days. She noticed an enlarged tick that was firmly attached to her anterior upper extremity Tuesday last week which she believes attached itself the Sunday prior after being outside all day. Denies rash, fevers, joint aches, URI symptoms. Her energy has improved overall but she still feels depleted of energy.  Review of Systems  Constitutional: Positive for fatigue. Negative for fever and chills.  HENT: Negative for congestion, ear pain, rhinorrhea, sinus pressure and sore throat.   Respiratory: Negative for cough and shortness of breath.   Cardiovascular: Negative for chest pain.  Musculoskeletal: Positive for myalgias. Negative for arthralgias.  Skin: Negative for rash.  Neurological: Negative for dizziness.       Mild headaches       Past Medical History  Diagnosis Date  . History of chicken pox   . History of UTI     History   Social History  . Marital Status: Single    Spouse Name: N/A  . Number of Children: N/A  . Years of Education: N/A   Occupational History  . Not on file.   Social History Main Topics  . Smoking status: Former Research scientist (life sciences)  . Smokeless tobacco: Never Used     Comment: smoked in college  . Alcohol Use: 0.0 oz/week    0 Standard drinks or equivalent per week     Comment: occ-few beers a week  . Drug Use: No  . Sexual Activity: Not on file   Other Topics Concern  . Not on file   Social History Narrative    Past Surgical History  Procedure Laterality Date  . Cholecystectomy  1995  . Resection distal clavical  2009    Family History  Problem Relation Age of Onset  . Colon cancer Paternal Aunt   . Skin cancer Paternal Grandfather   . Skin cancer Paternal Grandmother   . Skin cancer Father   .  Prostate cancer Maternal Grandfather   . Prostate cancer Maternal Uncle   . High blood pressure Father   . High blood pressure Paternal Grandmother   . Depression Sister     No Known Allergies  No current outpatient prescriptions on file prior to visit.   No current facility-administered medications on file prior to visit.    BP 104/64 mmHg  Pulse 60  Temp(Src) 97.9 F (36.6 C) (Oral)  Ht 5\' 10"  (1.778 m)  Wt 173 lb 8 oz (78.699 kg)  BMI 24.89 kg/m2  LMP 09/28/2014    Objective:   Physical Exam  Constitutional: She is oriented to person, place, and time. She appears well-developed.  HENT:  Right Ear: Tympanic membrane and ear canal normal.  Left Ear: Tympanic membrane and ear canal normal.  Nose: Nose normal.  Mouth/Throat: Oropharynx is clear and moist.  Eyes: Conjunctivae are normal. Pupils are equal, round, and reactive to light.  Neck: Neck supple.  Cardiovascular: Normal rate and regular rhythm.   Pulmonary/Chest: Effort normal and breath sounds normal.  Musculoskeletal: Normal range of motion. She exhibits no edema.  Lymphadenopathy:    She has no cervical adenopathy.  Neurological: She is alert and oriented to person, place, and time.  Skin: Skin is warm and dry. No  rash noted.  Psychiatric: She has a normal mood and affect.          Assessment & Plan:  Tick bite:  Do no suspect Lyme's disease or RMSP at this point. No rash, fevers, decrease ROM upon exam. No URI symptoms present. She appears well. Education provided on s/s of both diseases and to notify me of any signs of rashes, joint aches, fevers, etc. Prophylaxis provided with 200mg  of Doxycycline once per recommendations. Follow up as needed.

## 2014-10-13 NOTE — Progress Notes (Signed)
Pre visit review using our clinic review tool, if applicable. No additional management support is needed unless otherwise documented below in the visit note. 

## 2014-10-15 NOTE — Addendum Note (Signed)
Addended by: Pleas Koch on: 10/15/2014 01:53 PM   Modules accepted: Level of Service

## 2015-06-01 ENCOUNTER — Other Ambulatory Visit: Payer: No Typology Code available for payment source

## 2015-06-05 ENCOUNTER — Encounter: Payer: No Typology Code available for payment source | Admitting: Family Medicine

## 2015-06-08 ENCOUNTER — Encounter: Payer: No Typology Code available for payment source | Admitting: Family Medicine

## 2016-01-24 ENCOUNTER — Telehealth: Payer: Self-pay | Admitting: Family Medicine

## 2016-01-24 DIAGNOSIS — Z0000 Encounter for general adult medical examination without abnormal findings: Secondary | ICD-10-CM

## 2016-01-24 NOTE — Telephone Encounter (Signed)
-----   Message from Ellamae Sia sent at 01/19/2016  3:15 PM EDT ----- Regarding: Lab orders for Tuesday, 8.8.17 Patient is scheduled for CPX labs, please order future labs, Thanks , Karna Christmas

## 2016-01-26 ENCOUNTER — Other Ambulatory Visit (INDEPENDENT_AMBULATORY_CARE_PROVIDER_SITE_OTHER): Payer: 59

## 2016-01-26 DIAGNOSIS — Z0000 Encounter for general adult medical examination without abnormal findings: Secondary | ICD-10-CM

## 2016-01-26 LAB — COMPREHENSIVE METABOLIC PANEL
ALT: 14 U/L (ref 0–35)
AST: 17 U/L (ref 0–37)
Albumin: 3.8 g/dL (ref 3.5–5.2)
Alkaline Phosphatase: 47 U/L (ref 39–117)
BUN: 9 mg/dL (ref 6–23)
CO2: 27 mEq/L (ref 19–32)
Calcium: 9 mg/dL (ref 8.4–10.5)
Chloride: 107 mEq/L (ref 96–112)
Creatinine, Ser: 0.71 mg/dL (ref 0.40–1.20)
GFR: 95.7 mL/min (ref >60.00)
Glucose, Bld: 95 mg/dL (ref 70–99)
POTASSIUM: 4.2 meq/L (ref 3.5–5.1)
Sodium: 138 mEq/L (ref 135–145)
Total Bilirubin: 0.4 mg/dL (ref 0.2–1.2)
Total Protein: 6.9 g/dL (ref 6.0–8.3)

## 2016-01-26 LAB — LIPID PANEL
Cholesterol: 128 mg/dL (ref 0–200)
HDL: 61.4 mg/dL (ref >39.00)
LDL Cholesterol: 55 mg/dL (ref 0–99)
NonHDL: 66.85
TRIGLYCERIDES: 57 mg/dL (ref 0.0–149.0)
Total CHOL/HDL Ratio: 2
VLDL: 11.4 mg/dL (ref 0.0–40.0)

## 2016-01-26 LAB — CBC WITH DIFFERENTIAL/PLATELET
Basophils Absolute: 0 10*3/uL (ref 0.0–0.1)
Basophils Relative: 0.4 % (ref 0.0–3.0)
Eosinophils Absolute: 0.3 10*3/uL (ref 0.0–0.7)
Eosinophils Relative: 6.2 % — ABNORMAL HIGH (ref 0.0–5.0)
HCT: 37.4 % (ref 36.0–46.0)
Hemoglobin: 12.7 g/dL (ref 12.0–15.0)
Lymphocytes Relative: 30.9 % (ref 12.0–46.0)
Lymphs Abs: 1.6 10*3/uL (ref 0.7–4.0)
MCHC: 33.9 g/dL (ref 30.0–36.0)
MCV: 92.2 fl (ref 78.0–100.0)
Monocytes Absolute: 0.6 10*3/uL (ref 0.1–1.0)
Monocytes Relative: 10.5 % (ref 3.0–12.0)
Neutro Abs: 2.7 10*3/uL (ref 1.4–7.7)
Neutrophils Relative %: 52 % (ref 43.0–77.0)
Platelets: 253 10*3/uL (ref 150.0–400.0)
RBC: 4.06 Mil/uL (ref 3.87–5.11)
RDW: 12.9 % (ref 11.5–15.5)
WBC: 5.2 10*3/uL (ref 4.0–10.5)

## 2016-01-26 LAB — TSH: TSH: 1.62 u[IU]/mL (ref 0.35–4.50)

## 2016-02-02 ENCOUNTER — Ambulatory Visit (INDEPENDENT_AMBULATORY_CARE_PROVIDER_SITE_OTHER): Payer: 59 | Admitting: Family Medicine

## 2016-02-02 ENCOUNTER — Encounter: Payer: Self-pay | Admitting: Family Medicine

## 2016-02-02 ENCOUNTER — Other Ambulatory Visit (HOSPITAL_COMMUNITY)
Admission: RE | Admit: 2016-02-02 | Discharge: 2016-02-02 | Disposition: A | Discharge status: Home / Self Care | Payer: 59 | Source: Ambulatory Visit | Attending: Family Medicine | Admitting: Family Medicine

## 2016-02-02 VITALS — BP 118/64 | HR 55 | Temp 98.5°F | Ht 70.25 in | Wt 182.2 lb

## 2016-02-02 DIAGNOSIS — Z01419 Encounter for gynecological examination (general) (routine) without abnormal findings: Secondary | ICD-10-CM

## 2016-02-02 DIAGNOSIS — Z0000 Encounter for general adult medical examination without abnormal findings: Secondary | ICD-10-CM

## 2016-02-02 NOTE — Progress Notes (Signed)
Pre visit review using our clinic review tool, if applicable. No additional management support is needed unless otherwise documented below in the visit note. 

## 2016-02-02 NOTE — Assessment & Plan Note (Signed)
Pap today Last was 2015- ascus no HPV lost to f/u  Not sexually active Does not desire contraception

## 2016-02-02 NOTE — Patient Instructions (Addendum)
Get a flu shot every fall  Take care of yourself  Keep exercising  Don't forget to make your mammogram appointment  Pap today

## 2016-02-02 NOTE — Progress Notes (Signed)
Subjective:    Patient ID: Angela Gates, female    DOB: [birth year], 40 y.o.   MRN: [id number]  HPI Here for health maintenance exam and to review chronic medical problems    Has been outdoors a lot / kayaking a lot  Good for her   Wt Readings from Last 3 Encounters:  02/02/16 182 lb 4 oz (82.7 kg)  10/13/14 173 lb 8 oz (78.7 kg)  06/02/14 163 lb (73.9 kg)  bmi is 25.9 Frustrated by weight  Eating healthy  Getting lots of exercise    HIV screen-not high risk /declines   Flu shots - does not always get   Pap 12/15- ascus but no HPV Needs a pap today  Does not desire std screen   Periods are regular  On the heavy side and 5-7 days Not sexually active  Does not desire OC  Not anemic   Td 7/10   12/15 mammogram nl  Self breast exam -no lumps   Good labs Results for orders placed or performed in visit on 01/26/16  CBC with Differential/Platelet  Result Value Ref Range   WBC 5.2 4.0 - 10.5 K/uL   RBC 4.06 3.87 - 5.11 Mil/uL   Hemoglobin 12.7 12.0 - 15.0 g/dL   HCT 37.4 36.0 - 46.0 %   MCV 92.2 78.0 - 100.0 fl   MCHC 33.9 30.0 - 36.0 g/dL   RDW 12.9 11.5 - 15.5 %   Platelets 253.0 150.0 - 400.0 K/uL   Neutrophils Relative % 52.0 43.0 - 77.0 %   Lymphocytes Relative 30.9 12.0 - 46.0 %   Monocytes Relative 10.5 3.0 - 12.0 %   Eosinophils Relative 6.2 (H) 0.0 - 5.0 %   Basophils Relative 0.4 0.0 - 3.0 %   Neutro Abs 2.7 1.4 - 7.7 K/uL   Lymphs Abs 1.6 0.7 - 4.0 K/uL   Monocytes Absolute 0.6 0.1 - 1.0 K/uL   Eosinophils Absolute 0.3 0.0 - 0.7 K/uL   Basophils Absolute 0.0 0.0 - 0.1 K/uL  Comprehensive metabolic panel  Result Value Ref Range   Sodium 138 135 - 145 mEq/L   Potassium 4.2 3.5 - 5.1 mEq/L   Chloride 107 96 - 112 mEq/L   CO2 27 19 - 32 mEq/L   Glucose, Bld 95 70 - 99 mg/dL   BUN 9 6 - 23 mg/dL   Creatinine, Ser 0.71 0.40 - 1.20 mg/dL   Total Bilirubin 0.4 0.2 - 1.2 mg/dL   Alkaline Phosphatase 47 39 - 117 U/L   AST 17 0 - 37 U/L   ALT 14 0 -  35 U/L   Total Protein 6.9 6.0 - 8.3 g/dL   Albumin 3.8 3.5 - 5.2 g/dL   Calcium 9.0 8.4 - 10.5 mg/dL   GFR 95.70 >60.00 mL/min  Lipid panel  Result Value Ref Range   Cholesterol 128 0 - 200 mg/dL   Triglycerides 57.0 0.0 - 149.0 mg/dL   HDL 61.40 >39.00 mg/dL   VLDL 11.4 0.0 - 40.0 mg/dL   LDL Cholesterol 55 0 - 99 mg/dL   Total CHOL/HDL Ratio 2    NonHDL 66.85   TSH  Result Value Ref Range   TSH 1.62 0.35 - 4.50 uIU/mL     Patient Active Problem List   Diagnosis Date Noted  . Encounter for screening mammogram for breast cancer 06/02/2014  . Routine general medical examination at a health care facility 05/28/2013  . Encounter for routine gynecological examination 05/28/2013  .  ANXIETY 12/02/2008  . ALLERGIC RHINITIS 11/22/2007  . TOBACCO USE, QUIT 11/21/2007   Past Medical History:  Diagnosis Date  . History of chicken pox   . History of UTI    Past Surgical History:  Procedure Laterality Date  . CHOLECYSTECTOMY  1995  . RESECTION DISTAL CLAVICAL  2009   Social History  Substance Use Topics  . Smoking status: Former Research scientist (life sciences)  . Smokeless tobacco: Never Used     Comment: smoked in college  . Alcohol use 0.0 oz/week     Comment: occ-few beers a week   Family History  Problem Relation Age of Onset  . Colon cancer Paternal Aunt   . Skin cancer Paternal Grandfather   . Skin cancer Paternal Grandmother   . Skin cancer Father   . Prostate cancer Maternal Grandfather   . Prostate cancer Maternal Uncle   . High blood pressure Father   . High blood pressure Paternal Grandmother   . Depression Sister    No Known Allergies Current Outpatient Prescriptions on File Prior to Visit  Medication Sig Dispense Refill  . fluticasone (FLONASE) 50 MCG/ACT nasal spray Place into both nostrils as needed for allergies or rhinitis.     No current facility-administered medications on file prior to visit.     Review of Systems    Review of Systems  Constitutional: Negative for  fever, appetite change, fatigue and unexpected weight change.  Eyes: Negative for pain and visual disturbance.  Respiratory: Negative for cough and shortness of breath.   Cardiovascular: Negative for cp or palpitations    Gastrointestinal: Negative for nausea, diarrhea and constipation.  Genitourinary: Negative for urgency and frequency.  Skin: Negative for pallor or rash   Neurological: Negative for weakness, light-headedness, numbness and headaches.  Hematological: Negative for adenopathy. Does not bruise/bleed easily.  Psychiatric/Behavioral: Negative for dysphoric mood. The patient is not nervous/anxious.      Objective:   Physical Exam  Constitutional: She appears well-developed and well-nourished. No distress.  Well appearing   HENT:  Head: Normocephalic and atraumatic.  Right Ear: External ear normal.  Left Ear: External ear normal.  Mouth/Throat: Oropharynx is clear and moist.  Eyes: Conjunctivae and EOM are normal. Pupils are equal, round, and reactive to light. No scleral icterus.  Neck: Normal range of motion. Neck supple. No JVD present. Carotid bruit is not present. No thyromegaly present.  Cardiovascular: Normal rate, regular rhythm, normal heart sounds and intact distal pulses.  Exam reveals no gallop.   Pulmonary/Chest: Effort normal and breath sounds normal. No respiratory distress. She has no wheezes. She exhibits no tenderness.  Abdominal: Soft. Bowel sounds are normal. She exhibits no distension, no abdominal bruit and no mass. There is no tenderness.  Genitourinary: No breast swelling, tenderness, discharge or bleeding.  Genitourinary Comments:         Anus appears normal w/o hemorrhoids or masses     External genitalia : nl appearance and hair distribution/no lesions     Urethral meatus : nl size, no lesions or prolapse     Urethra: no masses, tenderness or scarring    Bladder : no masses or tenderness     Vagina: nl general appearance, no  discharge or  Lesions, no significant cystocele  or rectocele     Cervix: no lesions/ discharge or friability    Uterus: nl size, contour, position, and mobility (not fixed) , non tender    Adnexa : no masses, tenderness, enlargement or nodularity  Breast exam: No mass, nodules, thickening, tenderness, bulging, retraction, inflamation, nipple discharge or skin changes noted.  No axillary or clavicular LA.      Musculoskeletal: Normal range of motion. She exhibits no edema or tenderness.  Lymphadenopathy:    She has no cervical adenopathy.  Neurological: She is alert. She has normal reflexes. No cranial nerve deficit. She exhibits normal muscle tone. Coordination normal.  Skin: Skin is warm and dry. No rash noted. No erythema. No pallor.  Psychiatric: She has a normal mood and affect.          Assessment & Plan:   Problem List Items Addressed This Visit      Other   Routine general medical examination at a health care facility - Primary    Reviewed health habits including diet and exercise and skin cancer prevention Reviewed appropriate screening tests for age  Also reviewed health mt list, fam hx and immunization status , as well as social and family history   See HPI Labs reviewed Pap/gyn exam done  Pt will call to schedule her own mammogram      Encounter for routine gynecological examination    Pap today Last was 2015- ascus no HPV lost to f/u  Not sexually active Does not desire contraception       Relevant Orders   Cytology - PAP    Other Visit Diagnoses   None.

## 2016-02-02 NOTE — Assessment & Plan Note (Signed)
Reviewed health habits including diet and exercise and skin cancer prevention Reviewed appropriate screening tests for age  Also reviewed health mt list, fam hx and immunization status , as well as social and family history   See HPI Labs reviewed Pap/gyn exam done  Pt will call to schedule her own mammogram

## 2016-02-04 LAB — CYTOLOGY - PAP

## 2016-03-24 ENCOUNTER — Other Ambulatory Visit: Payer: Self-pay | Admitting: Family Medicine

## 2016-03-24 DIAGNOSIS — Z1231 Encounter for screening mammogram for malignant neoplasm of breast: Secondary | ICD-10-CM

## 2016-04-07 ENCOUNTER — Ambulatory Visit
Admission: RE | Admit: 2016-04-07 | Discharge: 2016-04-07 | Disposition: A | Discharge status: Home / Self Care | Payer: 59 | Source: Ambulatory Visit | Attending: Family Medicine | Admitting: Family Medicine

## 2016-04-07 DIAGNOSIS — Z1231 Encounter for screening mammogram for malignant neoplasm of breast: Secondary | ICD-10-CM

## 2016-06-27 ENCOUNTER — Telehealth: Payer: Self-pay

## 2016-06-27 MED ORDER — LEVONORGESTREL-ETHINYL ESTRAD 0.1-20 MG-MCG PO TABS: 1.0000 | ORAL_TABLET | Freq: Every day | ORAL | 11 refills | Status: DC

## 2016-06-27 NOTE — Telephone Encounter (Signed)
I'm not sure what she has been on in the past  I will send a generic for lessina  If any problems or side eff or if she pref a diff brand let me know

## 2016-06-27 NOTE — Telephone Encounter (Signed)
Pt left v/m; pt was seen 02/02/16 for annual; pt said she was told if she wanted to restart [facility] oral med to call and would not have to schedule appt. Pt request cb. [place].

## 2016-06-28 NOTE — Telephone Encounter (Signed)
(  per DPR) left detailed voicemail letting pt know Rx sent and advise pt of Dr. Marliss Coots comments/instructions

## 2017-01-31 ENCOUNTER — Telehealth (INDEPENDENT_AMBULATORY_CARE_PROVIDER_SITE_OTHER): Payer: 59 | Admitting: Family Medicine

## 2017-01-31 DIAGNOSIS — Z0000 Encounter for general adult medical examination without abnormal findings: Secondary | ICD-10-CM

## 2017-01-31 NOTE — Telephone Encounter (Signed)
-----   Message from Ellamae Sia sent at 01/26/2017 10:29 AM EDT ----- Regarding: Lab orders for Wednesday, 8.15.18 Patient is scheduled for CPX labs, please order future labs, Thanks , Karna Christmas

## 2017-02-01 ENCOUNTER — Other Ambulatory Visit (INDEPENDENT_AMBULATORY_CARE_PROVIDER_SITE_OTHER): Payer: 59

## 2017-02-01 DIAGNOSIS — Z0000 Encounter for general adult medical examination without abnormal findings: Secondary | ICD-10-CM | POA: Diagnosis not present

## 2017-02-01 LAB — CBC WITH DIFFERENTIAL/PLATELET
Basophils Absolute: 0.1 10*3/uL (ref 0.0–0.1)
Basophils Relative: 1.3 % (ref 0.0–3.0)
Eosinophils Absolute: 0.4 10*3/uL (ref 0.0–0.7)
Eosinophils Relative: 6.9 % — ABNORMAL HIGH (ref 0.0–5.0)
HCT: 39.2 % (ref 36.0–46.0)
Hemoglobin: 12.8 g/dL (ref 12.0–15.0)
Lymphocytes Relative: 32.3 % (ref 12.0–46.0)
Lymphs Abs: 1.8 10*3/uL (ref 0.7–4.0)
MCHC: 32.6 g/dL (ref 30.0–36.0)
MCV: 94.3 fl (ref 78.0–100.0)
MONOS PCT: 8.7 % (ref 3.0–12.0)
Monocytes Absolute: 0.5 10*3/uL (ref 0.1–1.0)
Neutro Abs: 2.8 10*3/uL (ref 1.4–7.7)
Neutrophils Relative %: 50.8 % (ref 43.0–77.0)
Platelets: 258 10*3/uL (ref 150.0–400.0)
RBC: 4.16 Mil/uL (ref 3.87–5.11)
RDW: 13 % (ref 11.5–15.5)
WBC: 5.5 10*3/uL (ref 4.0–10.5)

## 2017-02-01 LAB — COMPREHENSIVE METABOLIC PANEL
ALT: 13 U/L (ref 0–35)
AST: 17 U/L (ref 0–37)
Albumin: 3.9 g/dL (ref 3.5–5.2)
Alkaline Phosphatase: 41 U/L (ref 39–117)
BUN: 15 mg/dL (ref 6–23)
CO2: 28 mEq/L (ref 19–32)
Calcium: 8.7 mg/dL (ref 8.4–10.5)
Chloride: 105 mEq/L (ref 96–112)
Creatinine, Ser: 0.82 mg/dL (ref 0.40–1.20)
GFR: 80.66 mL/min (ref >60.00)
GLUCOSE: 96 mg/dL (ref 70–99)
Potassium: 4 mEq/L (ref 3.5–5.1)
Sodium: 138 mEq/L (ref 135–145)
Total Bilirubin: 0.4 mg/dL (ref 0.2–1.2)
Total Protein: 6.6 g/dL (ref 6.0–8.3)

## 2017-02-01 LAB — LIPID PANEL
Cholesterol: 119 mg/dL (ref 0–200)
HDL: 54.3 mg/dL (ref >39.00)
LDL Cholesterol: 50 mg/dL (ref 0–99)
NonHDL: 64.3
TRIGLYCERIDES: 70 mg/dL (ref 0.0–149.0)
Total CHOL/HDL Ratio: 2
VLDL: 14 mg/dL (ref 0.0–40.0)

## 2017-02-01 LAB — TSH: TSH: 1.43 u[IU]/mL (ref 0.35–4.50)

## 2017-02-08 ENCOUNTER — Ambulatory Visit (INDEPENDENT_AMBULATORY_CARE_PROVIDER_SITE_OTHER): Payer: 59 | Admitting: Family Medicine

## 2017-02-08 ENCOUNTER — Encounter: Payer: Self-pay | Admitting: Family Medicine

## 2017-02-08 ENCOUNTER — Other Ambulatory Visit (HOSPITAL_COMMUNITY)
Admission: RE | Admit: 2017-02-08 | Discharge: 2017-02-08 | Disposition: A | Discharge status: Home / Self Care | Payer: 59 | Source: Ambulatory Visit | Attending: Family Medicine | Admitting: Family Medicine

## 2017-02-08 VITALS — BP 100/62 | HR 54 | Temp 97.7°F | Ht 69.75 in | Wt 180.8 lb

## 2017-02-08 DIAGNOSIS — Z01419 Encounter for gynecological examination (general) (routine) without abnormal findings: Secondary | ICD-10-CM

## 2017-02-08 DIAGNOSIS — Z0000 Encounter for general adult medical examination without abnormal findings: Secondary | ICD-10-CM

## 2017-02-08 MED ORDER — FLUTICASONE PROPIONATE 50 MCG/ACT NA SUSP: 1.0000 | NASAL | 11 refills | Status: DC | PRN

## 2017-02-08 NOTE — Patient Instructions (Addendum)
Don't forget to get a flu shot in the fall   You will be due for a mammogram in October - don't forget to schedule it  If you need help or a referral/ let us know   Pap today with gonorrhea and chlamydia screen

## 2017-02-08 NOTE — Progress Notes (Signed)
Subjective:    Patient ID: Angela Gates, female    DOB: 02-04-1976, 41 y.o.   MRN: [id number]  HPI Here for health maintenance exam and to review chronic medical problems    Doing well overall  [place]ent to [place]   Eating healthy  Not exercising as much as she was but doing it (less time)  Working more  Wants to kayak more   [employer] from Last 3 Encounters:  02/08/17 180 lb 12.8 oz (82 kg)  02/02/16 182 lb 4 oz (82.7 kg)  10/13/14 173 lb 8 oz (78.7 kg)   26.13 kg/m  Flu shot -- in the fall   Pap 8/17 negative  On lessina OC currently (she called to get on it in [place])  She stayed on it for menses  Not as regular as prior pills -she gets her period a week earlier  Not active-would rather get off of it   W/o OC - she gets 2 heavy days with menses and she can tolerate it   Does want std testing (gc and chlamydia) with pap Not hiv or rpr    Td 7/10  Mammogram 10/17 -negative  Self breast exam -no breast lumps     Labs: Results for orders placed or performed in visit on 01/31/17  CBC with Differential/Platelet  Result Value Ref Range   WBC 5.5 4.0 - 10.5 K/uL   RBC 4.16 3.87 - 5.11 Mil/uL   Hemoglobin 12.8 12.0 - 15.0 g/dL   HCT 39.2 36.0 - 46.0 %   MCV 94.3 78.0 - 100.0 fl   MCHC 32.6 30.0 - 36.0 g/dL   RDW 13.0 11.5 - 15.5 %   Platelets 258.0 150.0 - 400.0 K/uL   Neutrophils Relative % 50.8 43.0 - 77.0 %   Lymphocytes Relative 32.3 12.0 - 46.0 %   Monocytes Relative 8.7 3.0 - 12.0 %   Eosinophils Relative 6.9 (H) 0.0 - 5.0 %   Basophils Relative 1.3 0.0 - 3.0 %   Neutro Abs 2.8 1.4 - 7.7 K/uL   Lymphs Abs 1.8 0.7 - 4.0 K/uL   Monocytes Absolute 0.5 0.1 - 1.0 K/uL   Eosinophils Absolute 0.4 0.0 - 0.7 K/uL   Basophils Absolute 0.1 0.0 - 0.1 K/uL  Comprehensive metabolic panel  Result Value Ref Range   Sodium 138 135 - 145 mEq/L   Potassium 4.0 3.5 - 5.1 mEq/L   Chloride 105 96 - 112 mEq/L   CO2 28 19 - 32 mEq/L   Glucose, Bld 96 70 - 99  mg/dL   BUN 15 6 - 23 mg/dL   Creatinine, Ser 0.82 0.40 - 1.20 mg/dL   Total Bilirubin 0.4 0.2 - 1.2 mg/dL   Alkaline Phosphatase 41 39 - 117 U/L   AST 17 0 - 37 U/L   ALT 13 0 - 35 U/L   Total Protein 6.6 6.0 - 8.3 g/dL   Albumin 3.9 3.5 - 5.2 g/dL   Calcium 8.7 8.4 - 10.5 mg/dL   GFR 80.66 >60.00 mL/min  Lipid panel  Result Value Ref Range   Cholesterol 119 0 - 200 mg/dL   Triglycerides 70.0 0.0 - 149.0 mg/dL   HDL 54.30 >39.00 mg/dL   VLDL 14.0 0.0 - 40.0 mg/dL   LDL Cholesterol 50 0 - 99 mg/dL   Total CHOL/HDL Ratio 2    NonHDL 64.30   TSH  Result Value Ref Range   TSH 1.43 0.35 - 4.50 uIU/mL    Patient  Active Problem List   Diagnosis Date Noted  . Encounter for screening mammogram for breast cancer 06/02/2014  . Routine general medical examination at a health care facility 05/28/2013  . Encounter for routine gynecological examination 05/28/2013  . ANXIETY 12/02/2008  . ALLERGIC RHINITIS 11/22/2007  . TOBACCO USE, QUIT 11/21/2007   Past Medical History:  Diagnosis Date  . History of chicken pox   . History of UTI    Past Surgical History:  Procedure Laterality Date  . CHOLECYSTECTOMY  1995  . RESECTION DISTAL CLAVICAL  2009   Social History  Substance Use Topics  . Smoking status: Former Research scientist (life sciences)  . Smokeless tobacco: Never Used     Comment: smoked in college  . Alcohol use 0.0 oz/week     Comment: occ-few beers a week   Family History  Problem Relation Age of Onset  . Colon cancer Paternal Aunt   . Skin cancer Paternal Grandfather   . Skin cancer Paternal Grandmother   . High blood pressure Paternal Grandmother   . Skin cancer Father   . High blood pressure Father   . Prostate cancer Maternal Grandfather   . Prostate cancer Maternal Uncle   . Depression Sister    No Known Allergies No current outpatient prescriptions on file prior to visit.   No current facility-administered medications on file prior to visit.      Review of Systems Review of  Systems  Constitutional: Negative for fever, appetite change, fatigue and unexpected weight change.  Eyes: Negative for pain and visual disturbance.  Respiratory: Negative for cough and shortness of breath.   Cardiovascular: Negative for cp or palpitations    Gastrointestinal: Negative for nausea, diarrhea and constipation.  Genitourinary: Negative for urgency and frequency.  Skin: Negative for pallor or rash   Neurological: Negative for weakness, light-headedness, numbness and headaches.  Hematological: Negative for adenopathy. Does not bruise/bleed easily.  Psychiatric/Behavioral: Negative for dysphoric mood. The patient is not nervous/anxious.         Objective:   Physical Exam  Constitutional: She appears well-developed and well-nourished. No distress.  Well appearing   HENT:  Head: Normocephalic and atraumatic.  Right Ear: External ear normal.  Left Ear: External ear normal.  Mouth/Throat: Oropharynx is clear and moist.  Eyes: Pupils are equal, round, and reactive to light. Conjunctivae and EOM are normal. No scleral icterus.  Neck: Normal range of motion. Neck supple. No JVD present. Carotid bruit is not present. No thyromegaly present.  Cardiovascular: Normal rate, regular rhythm, normal heart sounds and intact distal pulses.  Exam reveals no gallop.   Pulmonary/Chest: Effort normal and breath sounds normal. No respiratory distress. She has no wheezes. She exhibits no tenderness.  Abdominal: Soft. Bowel sounds are normal. She exhibits no distension, no abdominal bruit and no mass. There is no tenderness.  Genitourinary: No breast swelling, tenderness, discharge or bleeding.  Genitourinary Comments: Breast exam: No mass, nodules, thickening, tenderness, bulging, retraction, inflamation, nipple discharge or skin changes noted.  No axillary or clavicular LA.             Anus appears normal w/o hemorrhoids or masses     External genitalia : nl appearance and hair  distribution/no lesions     Urethral meatus : nl size, no lesions or prolapse     Urethra: no masses, tenderness or scarring    Bladder : no masses or tenderness     Vagina: nl general appearance, no discharge or  Lesions, no significant cystocele  or rectocele     Cervix: no lesions/ discharge or friability (scant spotting with pap)   Uterus: nl size, contour, position, and mobility (not fixed) , non tender    Adnexa : no masses, tenderness, enlargement or nodularity        Musculoskeletal: Normal range of motion. She exhibits no edema or tenderness.  Lymphadenopathy:    She has no cervical adenopathy.  Neurological: She is alert. She has normal reflexes. No cranial nerve deficit. She exhibits normal muscle tone. Coordination normal.  Skin: Skin is warm and dry. No rash noted. No erythema. No pallor.  Tanned with few lentigines   Psychiatric: She has a normal mood and affect.          Assessment & Plan:   Problem List Items Addressed This Visit      Other   Encounter for routine gynecological examination    Exam and pap  req gc/chlam with her pap  Declines HIV and RPR at this time  No c/o  Will stop OC as she thinks she does not need it      Relevant Orders   Cytology - PAP   Routine general medical examination at a health care facility - Primary    Reviewed health habits including diet and exercise and skin cancer prevention Reviewed appropriate screening tests for age  Also reviewed health mt list, fam hx and immunization status , as well as social and family history   See HPI Labs rev  Enc good self care  Stopping OC  Pap and gyn exam with std screen done  Will get flu shot in the fall She will schedule her own mammogram

## 2017-02-09 NOTE — Assessment & Plan Note (Signed)
Reviewed health habits including diet and exercise and skin cancer prevention Reviewed appropriate screening tests for age  Also reviewed health mt list, fam hx and immunization status , as well as social and family history   See HPI Labs rev  Enc good self care  Stopping OC  Pap and gyn exam with std screen done  Will get flu shot in the fall She will schedule her own mammogram

## 2017-02-09 NOTE — Assessment & Plan Note (Signed)
Exam and pap  req gc/chlam with her pap  Declines HIV and RPR at this time  No c/o  Will stop OC as she thinks she does not need it

## 2017-02-13 LAB — CYTOLOGY - PAP
Chlamydia: NEGATIVE
Diagnosis: NEGATIVE
HPV (WINDOPATH): NOT DETECTED
Neisseria Gonorrhea: NEGATIVE

## 2017-05-03 ENCOUNTER — Other Ambulatory Visit: Payer: Self-pay | Admitting: Family Medicine

## 2017-05-03 DIAGNOSIS — Z1231 Encounter for screening mammogram for malignant neoplasm of breast: Secondary | ICD-10-CM

## 2017-06-01 ENCOUNTER — Ambulatory Visit
Admission: RE | Admit: 2017-06-01 | Discharge: 2017-06-01 | Disposition: A | Discharge status: Home / Self Care | Payer: 59 | Source: Ambulatory Visit | Attending: Family Medicine | Admitting: Family Medicine

## 2017-06-01 DIAGNOSIS — Z1231 Encounter for screening mammogram for malignant neoplasm of breast: Secondary | ICD-10-CM

## 2017-06-02 ENCOUNTER — Other Ambulatory Visit: Payer: Self-pay | Admitting: Family Medicine

## 2017-06-02 DIAGNOSIS — R928 Other abnormal and inconclusive findings on diagnostic imaging of breast: Secondary | ICD-10-CM

## 2017-06-22 ENCOUNTER — Ambulatory Visit: Payer: 59

## 2017-06-22 ENCOUNTER — Ambulatory Visit
Admission: RE | Admit: 2017-06-22 | Discharge: 2017-06-22 | Disposition: A | Discharge status: Home / Self Care | Payer: 59 | Source: Ambulatory Visit | Attending: Family Medicine | Admitting: Family Medicine

## 2017-06-22 DIAGNOSIS — R928 Other abnormal and inconclusive findings on diagnostic imaging of breast: Secondary | ICD-10-CM

## 2017-11-30 ENCOUNTER — Telehealth: Payer: Self-pay

## 2017-11-30 MED ORDER — LEVONORGESTREL-ETHINYL ESTRAD 0.1-20 MG-MCG PO TABS: 1.0000 | ORAL_TABLET | Freq: Every day | ORAL | 11 refills | Status: DC

## 2017-11-30 NOTE — Telephone Encounter (Signed)
Pt last annual exam on 02/08/17.

## 2017-11-30 NOTE — Telephone Encounter (Signed)
Sending the prior OC she was on to [facility]  I did tell her to call any time she wanted to re start it   Let me know if any problems

## 2017-11-30 NOTE — Telephone Encounter (Signed)
Copied from [place] [id number]. Topic: General - Other >> Nov 30, 2017 10:14 AM Yvette Rack wrote: Reason for CRM: Pt states she would like a Rx for birth control medication. Offered to schedule an appt but pt states Dr. Glori Bickers told her no appt would be necessary she could just call and request a Rx for the birth control medication. Cb# [phone]

## 2017-11-30 NOTE — Telephone Encounter (Signed)
Pt notified Rx sent and advise of Dr. Tower's comments  

## 2018-02-11 ENCOUNTER — Telehealth: Payer: Self-pay | Admitting: Family Medicine

## 2018-02-11 DIAGNOSIS — Z0000 Encounter for general adult medical examination without abnormal findings: Secondary | ICD-10-CM

## 2018-02-11 NOTE — Telephone Encounter (Signed)
-----   Message from Ellamae Sia sent at 02/06/2018 10:40 AM EDT ----- Regarding: Lab orders for Monday, 8.26.19 Patient is scheduled for CPX labs, please order future labs, Thanks , Karna Christmas

## 2018-02-12 ENCOUNTER — Other Ambulatory Visit (INDEPENDENT_AMBULATORY_CARE_PROVIDER_SITE_OTHER): Payer: 59

## 2018-02-12 DIAGNOSIS — Z0000 Encounter for general adult medical examination without abnormal findings: Secondary | ICD-10-CM | POA: Diagnosis not present

## 2018-02-12 LAB — LIPID PANEL
CHOLESTEROL: 126 mg/dL (ref 0–200)
HDL: 55 mg/dL (ref >39.00)
LDL Cholesterol: 55 mg/dL (ref 0–99)
NonHDL: 70.74
Total CHOL/HDL Ratio: 2
Triglycerides: 78 mg/dL (ref 0.0–149.0)
VLDL: 15.6 mg/dL (ref 0.0–40.0)

## 2018-02-12 LAB — CBC WITH DIFFERENTIAL/PLATELET
BASOS ABS: 0 10*3/uL (ref 0.0–0.1)
Basophils Relative: 0.7 % (ref 0.0–3.0)
EOS PCT: 11.4 % — AB (ref 0.0–5.0)
Eosinophils Absolute: 0.6 10*3/uL (ref 0.0–0.7)
HCT: 38.3 % (ref 36.0–46.0)
Hemoglobin: 12.9 g/dL (ref 12.0–15.0)
LYMPHS ABS: 1.7 10*3/uL (ref 0.7–4.0)
Lymphocytes Relative: 32.2 % (ref 12.0–46.0)
MCHC: 33.7 g/dL (ref 30.0–36.0)
MCV: 92.9 fl (ref 78.0–100.0)
MONO ABS: 0.5 10*3/uL (ref 0.1–1.0)
Monocytes Relative: 10 % (ref 3.0–12.0)
NEUTROS PCT: 45.7 % (ref 43.0–77.0)
Neutro Abs: 2.4 10*3/uL (ref 1.4–7.7)
Platelets: 242 10*3/uL (ref 150.0–400.0)
RBC: 4.12 Mil/uL (ref 3.87–5.11)
RDW: 13 % (ref 11.5–15.5)
WBC: 5.3 10*3/uL (ref 4.0–10.5)

## 2018-02-12 LAB — COMPREHENSIVE METABOLIC PANEL
ALK PHOS: 40 U/L (ref 39–117)
ALT: 11 U/L (ref 0–35)
AST: 17 U/L (ref 0–37)
Albumin: 3.8 g/dL (ref 3.5–5.2)
BUN: 14 mg/dL (ref 6–23)
CO2: 26 mEq/L (ref 19–32)
CREATININE: 0.84 mg/dL (ref 0.40–1.20)
Calcium: 8.9 mg/dL (ref 8.4–10.5)
Chloride: 107 mEq/L (ref 96–112)
GFR: 78.07 mL/min (ref >60.00)
GLUCOSE: 97 mg/dL (ref 70–99)
Potassium: 3.9 mEq/L (ref 3.5–5.1)
Sodium: 138 mEq/L (ref 135–145)
TOTAL PROTEIN: 7.2 g/dL (ref 6.0–8.3)
Total Bilirubin: 0.5 mg/dL (ref 0.2–1.2)

## 2018-02-12 LAB — TSH: TSH: 1.98 u[IU]/mL (ref 0.35–4.50)

## 2018-02-14 ENCOUNTER — Encounter: Payer: Self-pay | Admitting: Family Medicine

## 2018-02-14 ENCOUNTER — Encounter: Payer: 59 | Admitting: Family Medicine

## 2018-02-14 ENCOUNTER — Other Ambulatory Visit (HOSPITAL_COMMUNITY)
Admission: RE | Admit: 2018-02-14 | Discharge: 2018-02-14 | Disposition: A | Discharge status: Home / Self Care | Payer: 59 | Source: Ambulatory Visit | Attending: Family Medicine | Admitting: Family Medicine

## 2018-02-14 ENCOUNTER — Ambulatory Visit (INDEPENDENT_AMBULATORY_CARE_PROVIDER_SITE_OTHER): Payer: 59 | Admitting: Family Medicine

## 2018-02-14 VITALS — BP 104/62 | HR 59 | Temp 98.1°F | Ht 70.0 in | Wt 184.5 lb

## 2018-02-14 DIAGNOSIS — Z01419 Encounter for gynecological examination (general) (routine) without abnormal findings: Secondary | ICD-10-CM | POA: Diagnosis not present

## 2018-02-14 DIAGNOSIS — Z0000 Encounter for general adult medical examination without abnormal findings: Secondary | ICD-10-CM | POA: Diagnosis not present

## 2018-02-14 MED ORDER — FLUTICASONE PROPIONATE 50 MCG/ACT NA SUSP: 1.0000 | NASAL | 11 refills | Status: DC | PRN

## 2018-02-14 MED ORDER — LEVONORGESTREL-ETHINYL ESTRAD 0.1-20 MG-MCG PO TABS: 1.0000 | ORAL_TABLET | Freq: Every day | ORAL | 3 refills | Status: DC

## 2018-02-14 NOTE — Patient Instructions (Addendum)
Get a flu shot in the fall   Labs look good Keep taking care of yourself   Pap and GC/Chlamydia pending

## 2018-02-14 NOTE — Assessment & Plan Note (Signed)
Reviewed health habits including diet and exercise and skin cancer prevention Reviewed appropriate screening tests for age  Also reviewed health mt list, fam hx and immunization status , as well as social and family history   See HPI Good labs incl cholesterol Enc flu shot in the fall  STD screen with gyn exam today

## 2018-02-14 NOTE — Progress Notes (Signed)
Subjective:    Patient ID: Angela Gates, female    DOB: 12-18-1975, 42 y.o.   MRN: [id number]  HPI Here for health maintenance exam and to review chronic medical problems    Good summer  [place]  [place]   Wt Readings from Last 3 Encounters:  02/14/18 184 lb 8 oz (83.7 kg)  02/08/17 180 lb 12.8 oz (82 kg)  02/02/16 182 lb 4 oz (82.7 kg)  taking care of herself - does exercise /kayak/rowing/walking - active and outdoors  Eats very healthy  Would like to loose a bit of wt - not obsessed 26.47 kg/m   Flu shot - has had in the past  Tetanus shot 7/10  Pap 8/18 neg with neg HPV STD screen neg last year   Will do pap  Will repeat GC / chlam   Back on OC - period is still a little wacky but just got back to it  In the past helped periods a lot  Wants to stay on this [place] 12/18- addnl views were normal  Self breast exam - no lumps or changes  [facility]   Had lasik this year   Eyes bother her with allergies occasionally    Labs / Results for orders placed or performed in visit on 02/12/18  TSH  Result Value Ref Range   TSH 1.98 0.35 - 4.50 uIU/mL  Lipid panel  Result Value Ref Range   Cholesterol 126 0 - 200 mg/dL   Triglycerides 78.0 0.0 - 149.0 mg/dL   HDL 55.00 >39.00 mg/dL   VLDL 15.6 0.0 - 40.0 mg/dL   LDL Cholesterol 55 0 - 99 mg/dL   Total CHOL/HDL Ratio 2    NonHDL 70.74   Comprehensive metabolic panel  Result Value Ref Range   Sodium 138 135 - 145 mEq/L   Potassium 3.9 3.5 - 5.1 mEq/L   Chloride 107 96 - 112 mEq/L   CO2 26 19 - 32 mEq/L   Glucose, Bld 97 70 - 99 mg/dL   BUN 14 6 - 23 mg/dL   Creatinine, Ser 0.84 0.40 - 1.20 mg/dL   Total Bilirubin 0.5 0.2 - 1.2 mg/dL   Alkaline Phosphatase 40 39 - 117 U/L   AST 17 0 - 37 U/L   ALT 11 0 - 35 U/L   Total Protein 7.2 6.0 - 8.3 g/dL   Albumin 3.8 3.5 - 5.2 g/dL   Calcium 8.9 8.4 - 10.5 mg/dL   GFR 78.07 >60.00 mL/min  CBC with  Differential/Platelet  Result Value Ref Range   WBC 5.3 4.0 - 10.5 K/uL   RBC 4.12 3.87 - 5.11 Mil/uL   Hemoglobin 12.9 12.0 - 15.0 g/dL   HCT 38.3 36.0 - 46.0 %   MCV 92.9 78.0 - 100.0 fl   MCHC 33.7 30.0 - 36.0 g/dL   RDW 13.0 11.5 - 15.5 %   Platelets 242.0 150.0 - 400.0 K/uL   Neutrophils Relative % 45.7 43.0 - 77.0 %   Lymphocytes Relative 32.2 12.0 - 46.0 %   Monocytes Relative 10.0 3.0 - 12.0 %   Eosinophils Relative 11.4 (H) 0.0 - 5.0 %   Basophils Relative 0.7 0.0 - 3.0 %   Neutro Abs 2.4 1.4 - 7.7 K/uL   Lymphs Abs 1.7 0.7 - 4.0 K/uL   Monocytes Absolute 0.5 0.1 - 1.0 K/uL   Eosinophils Absolute 0.6 0.0 - 0.7  K/uL   Basophils Absolute 0.0 0.0 - 0.1 K/uL     Patient Active Problem List   Diagnosis Date Noted  . Encounter for screening mammogram for breast cancer 06/02/2014  . Routine general medical examination at a health care facility 05/28/2013  . Encounter for routine gynecological examination 05/28/2013  . ANXIETY 12/02/2008  . ALLERGIC RHINITIS 11/22/2007  . TOBACCO USE, QUIT 11/21/2007   Past Medical History:  Diagnosis Date  . History of chicken pox   . History of UTI    Past Surgical History:  Procedure Laterality Date  . CHOLECYSTECTOMY  1995  . RESECTION DISTAL CLAVICAL  2009   Social History   Tobacco Use  . Smoking status: Former Research scientist (life sciences)  . Smokeless tobacco: Never Used  . Tobacco comment: smoked in college  Substance Use Topics  . Alcohol use: Yes    Alcohol/week: 0.0 standard drinks    Comment: occ-few beers a week  . Drug use: No   Family History  Problem Relation Age of Onset  . Colon cancer Paternal Aunt   . Skin cancer Paternal Grandfather   . Skin cancer Paternal Grandmother   . High blood pressure Paternal Grandmother   . Skin cancer Father   . High blood pressure Father   . Prostate cancer Maternal Grandfather   . Prostate cancer Maternal Uncle   . Depression Sister    No Known Allergies No current outpatient medications  on file prior to visit.   No current facility-administered medications on file prior to visit.     Review of Systems  Constitutional: Negative for activity change, appetite change, fatigue, fever and unexpected weight change.  HENT: Negative for congestion, ear pain, rhinorrhea, sinus pressure and sore throat.   Eyes: Negative for pain, redness and visual disturbance.  Respiratory: Negative for cough, shortness of breath and wheezing.   Cardiovascular: Negative for chest pain and palpitations.  Gastrointestinal: Negative for abdominal pain, blood in stool, constipation and diarrhea.  Endocrine: Negative for polydipsia and polyuria.  Genitourinary: Negative for dysuria, frequency and urgency.  Musculoskeletal: Negative for arthralgias, back pain and myalgias.  Skin: Negative for pallor and rash.  Allergic/Immunologic: Negative for environmental allergies.  Neurological: Negative for dizziness, syncope and headaches.  Hematological: Negative for adenopathy. Does not bruise/bleed easily.  Psychiatric/Behavioral: Negative for decreased concentration and dysphoric mood. The patient is not nervous/anxious.        Objective:   Physical Exam  Constitutional: She appears well-developed and well-nourished. No distress.  Well appearing   HENT:  Head: Normocephalic and atraumatic.  Right Ear: External ear normal.  Left Ear: External ear normal.  Mouth/Throat: Oropharynx is clear and moist.  Eyes: Pupils are equal, round, and reactive to light. Conjunctivae and EOM are normal. No scleral icterus.  Neck: Normal range of motion. Neck supple. No JVD present. Carotid bruit is not present. No thyromegaly present.  Cardiovascular: Normal rate, regular rhythm, normal heart sounds and intact distal pulses. Exam reveals no gallop.  Pulmonary/Chest: Effort normal and breath sounds normal. No respiratory distress. She has no wheezes. She exhibits no tenderness. No breast tenderness, discharge or bleeding.    Abdominal: Soft. Bowel sounds are normal. She exhibits no distension, no abdominal bruit and no mass. There is no tenderness.  Genitourinary: No breast tenderness, discharge or bleeding.  Genitourinary Comments: Breast exam: No mass, nodules, thickening, tenderness, bulging, retraction, inflamation, nipple discharge or skin changes noted.  No axillary or clavicular LA.  Anus appears normal w/o hemorrhoids or masses     External genitalia : nl appearance and hair distribution/no lesions     Urethral meatus : nl size, no lesions or prolapse     Urethra: no masses, tenderness or scarring    Bladder : no masses or tenderness     Vagina: nl general appearance, no discharge or  Lesions, no significant cystocele  or rectocele     Cervix: no lesions/ discharge or friability (slt spotting caused by pap but not friable, is about to start menses)    Uterus: nl size, contour, position, and mobility (not fixed) , non tender    Adnexa : no masses, tenderness, enlargement or nodularity        Musculoskeletal: Normal range of motion. She exhibits no edema or tenderness.  Lymphadenopathy:    She has no cervical adenopathy.  Neurological: She is alert. She has normal reflexes. She displays normal reflexes. No cranial nerve deficit. She exhibits normal muscle tone. Coordination normal.  Skin: Skin is warm and dry. No rash noted. No erythema. No pallor.  Tanned /olive complexion Few lentigines  Psychiatric: She has a normal mood and affect.  Pleasant           Assessment & Plan:   Problem List Items Addressed This Visit      Other   Encounter for routine gynecological examination    Nl exam- pap and GC/Chlamydia sent  New partner  No symptoms Continue current OC      Relevant Orders   Cytology - PAP   Routine general medical examination at a health care facility - Primary    Reviewed health habits including diet and exercise and skin cancer  prevention Reviewed appropriate screening tests for age  Also reviewed health mt list, fam hx and immunization status , as well as social and family history   See HPI Good labs incl cholesterol Enc flu shot in the fall  STD screen with gyn exam today

## 2018-02-14 NOTE — Assessment & Plan Note (Signed)
Nl exam- pap and GC/Chlamydia sent  New partner  No symptoms Continue current OC

## 2018-02-20 LAB — CYTOLOGY - PAP
Chlamydia: NEGATIVE
Diagnosis: NEGATIVE
HPV (WINDOPATH): NOT DETECTED
Neisseria Gonorrhea: NEGATIVE

## 2018-07-24 ENCOUNTER — Other Ambulatory Visit: Payer: Self-pay | Admitting: Family Medicine

## 2018-07-24 DIAGNOSIS — Z1231 Encounter for screening mammogram for malignant neoplasm of breast: Secondary | ICD-10-CM

## 2018-08-23 ENCOUNTER — Ambulatory Visit
Admission: RE | Admit: 2018-08-23 | Discharge: 2018-08-23 | Disposition: A | Discharge status: Home / Self Care | Payer: 59 | Source: Ambulatory Visit | Attending: Family Medicine | Admitting: Family Medicine

## 2018-08-23 DIAGNOSIS — Z1231 Encounter for screening mammogram for malignant neoplasm of breast: Secondary | ICD-10-CM

## 2018-08-27 ENCOUNTER — Other Ambulatory Visit: Payer: Self-pay | Admitting: Family Medicine

## 2018-08-27 DIAGNOSIS — R928 Other abnormal and inconclusive findings on diagnostic imaging of breast: Secondary | ICD-10-CM

## 2018-08-30 ENCOUNTER — Ambulatory Visit
Admission: RE | Admit: 2018-08-30 | Discharge: 2018-08-30 | Disposition: A | Discharge status: Home / Self Care | Payer: 59 | Source: Ambulatory Visit | Attending: Family Medicine | Admitting: Family Medicine

## 2018-08-30 ENCOUNTER — Other Ambulatory Visit: Payer: Self-pay

## 2018-08-30 DIAGNOSIS — R928 Other abnormal and inconclusive findings on diagnostic imaging of breast: Secondary | ICD-10-CM

## 2018-08-30 DIAGNOSIS — N6489 Other specified disorders of breast: Secondary | ICD-10-CM | POA: Diagnosis not present

## 2018-11-26 ENCOUNTER — Ambulatory Visit (INDEPENDENT_AMBULATORY_CARE_PROVIDER_SITE_OTHER): Payer: 59 | Admitting: Family Medicine

## 2018-11-26 VITALS — Wt 179.0 lb

## 2018-11-26 DIAGNOSIS — Z20828 Contact with and (suspected) exposure to other viral communicable diseases: Secondary | ICD-10-CM | POA: Diagnosis not present

## 2018-11-26 DIAGNOSIS — Z20822 Contact with and (suspected) exposure to covid-19: Secondary | ICD-10-CM

## 2018-11-26 NOTE — Assessment & Plan Note (Signed)
Discussed that exposure overall is low risk (both wearing mask and short time period for interaction). As no symptoms and not working at a high risk environment does not meet testing guidelines currently. Would recommend monitoring symptoms and if symptoms develop would recommend testing at that time.

## 2018-11-26 NOTE — Progress Notes (Signed)
I connected with Angela Gates on 11/26/18 at 12:00 PM EDT by video and verified that I am speaking with the correct person using two identifiers.   I discussed the limitations, risks, security and privacy concerns of performing an evaluation and management service by video and the availability of in person appointments. I also discussed with the patient that there may be a patient responsible charge related to this service. The patient expressed understanding and agreed to proceed.  Patient location: Home Provider Location: [place] Participants: Angela Gates and Angela Gates   Subjective:     Angela Gates is a 42 y.o. female presenting for Discuss COVID concerns (was exposed to someone who is positive for COVID- exposure dates were 6/4 and 11/23/2018. Both were wearing mask, were apart about 5 feet apart for most part, and time in contact was about 5 minutes each time (2 to 3 times on both days). 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  HENT: Negative for sore throat.   Respiratory: Negative for cough and shortness of breath.   Cardiovascular: Negative for chest pain.     Social History   Tobacco Use  Smoking Status Former Smoker  Smokeless Tobacco Never Used  Tobacco Comment   smoked in college        Objective:   BP Readings from Last 3 Encounters:  02/14/18 104/62  02/08/17 100/62  02/02/16 118/64   Wt Readings from Last 3 Encounters:  11/26/18 179 lb (81.2 kg)  02/14/18 184 lb 8 oz (83.7 kg)  02/08/17 180 lb 12.8 oz (82 kg)   Wt 179 lb (81.2 kg)  Comment: per patient  LMP 11/25/2018   BMI 25.68 kg/m    Physical Exam Constitutional:      Appearance: Normal appearance. She is not ill-appearing.  HENT:     Head: Normocephalic and atraumatic.     Right Ear: External ear normal.     Left Ear: External ear normal.  Eyes:     Conjunctiva/sclera: Conjunctivae normal.  Pulmonary:     Effort: Pulmonary effort is normal. No respiratory distress.  Neurological:     Mental Status: She is alert. Mental status is at baseline.  Psychiatric:        Mood and Affect: Mood normal.        Behavior: Behavior normal.        Thought Content: Thought content normal.        Judgment: Judgment normal.          Assessment & Plan:   Problem List Items Addressed This Visit      Other   Exposure to Covid-19 Virus - Primary    Discussed that exposure overall is low risk (both wearing mask and short time period for interaction). As no symptoms and not working at a high risk environment does not meet testing guidelines currently. Would recommend monitoring symptoms and if symptoms develop would recommend testing at that time.           Return  if symptoms worsen or fail to improve.  Angela Noe, MD

## 2018-12-24 ENCOUNTER — Telehealth: Payer: Self-pay | Admitting: Family Medicine

## 2018-12-24 MED ORDER — LEVONORGESTREL-ETHINYL ESTRAD 0.1-20 MG-MCG PO TABS: 1.0000 | ORAL_TABLET | Freq: Every day | ORAL | 1 refills | Status: DC

## 2018-12-24 NOTE — Telephone Encounter (Signed)
Left VM requesting [facility]

## 2018-12-24 NOTE — Telephone Encounter (Signed)
Pt called to get refill of birth control sent to [facility] [address] in [place]. She is requesting a 3 mo refill. Physical set up 02/20/19.  Pt has 1 mo refill left but would like to get 3 mo for convenience, she is not residing in [place] currently.

## 2018-12-24 NOTE — Addendum Note (Signed)
Addended by: Loura Pardon A on: 12/24/2018 12:50 PM   Modules accepted: Orders

## 2018-12-24 NOTE — Telephone Encounter (Signed)
I accidentally [facility] (her pharmacy on file)-please cancel that I re sent to [facility] [address] Thanks

## 2019-02-14 ENCOUNTER — Telehealth: Payer: Self-pay

## 2019-02-14 NOTE — Telephone Encounter (Signed)
Left detailed VM w COVID screen and back door lab info   

## 2019-02-17 ENCOUNTER — Telehealth: Payer: Self-pay | Admitting: Family Medicine

## 2019-02-17 DIAGNOSIS — Z0000 Encounter for general adult medical examination without abnormal findings: Secondary | ICD-10-CM

## 2019-02-17 NOTE — Telephone Encounter (Signed)
-----   Message from Ellamae Sia sent at 02/12/2019  9:53 AM EDT ----- Regarding: Lab orders for Monday, 8.31.20 Patient is scheduled for CPX labs, please order future labs, Thanks , Karna Christmas

## 2019-02-18 ENCOUNTER — Other Ambulatory Visit (INDEPENDENT_AMBULATORY_CARE_PROVIDER_SITE_OTHER): Payer: 59

## 2019-02-18 ENCOUNTER — Other Ambulatory Visit: Payer: Self-pay

## 2019-02-18 DIAGNOSIS — Z0000 Encounter for general adult medical examination without abnormal findings: Secondary | ICD-10-CM | POA: Diagnosis not present

## 2019-02-18 LAB — CBC WITH DIFFERENTIAL/PLATELET
Basophils Absolute: 0.1 10*3/uL (ref 0.0–0.1)
Basophils Relative: 1.2 % (ref 0.0–3.0)
Eosinophils Absolute: 0.4 10*3/uL (ref 0.0–0.7)
Eosinophils Relative: 5.7 % — ABNORMAL HIGH (ref 0.0–5.0)
HCT: 38.6 % (ref 36.0–46.0)
Hemoglobin: 13 g/dL (ref 12.0–15.0)
Lymphocytes Relative: 19.4 % (ref 12.0–46.0)
Lymphs Abs: 1.3 10*3/uL (ref 0.7–4.0)
MCHC: 33.8 g/dL (ref 30.0–36.0)
MCV: 93.6 fl (ref 78.0–100.0)
Monocytes Absolute: 0.6 10*3/uL (ref 0.1–1.0)
Monocytes Relative: 8.5 % (ref 3.0–12.0)
Neutro Abs: 4.5 10*3/uL (ref 1.4–7.7)
Neutrophils Relative %: 65.2 % (ref 43.0–77.0)
Platelets: 292 10*3/uL (ref 150.0–400.0)
RBC: 4.13 Mil/uL (ref 3.87–5.11)
RDW: 13.2 % (ref 11.5–15.5)
WBC: 7 10*3/uL (ref 4.0–10.5)

## 2019-02-18 LAB — COMPREHENSIVE METABOLIC PANEL
ALT: 11 U/L (ref 0–35)
AST: 17 U/L (ref 0–37)
Albumin: 3.9 g/dL (ref 3.5–5.2)
Alkaline Phosphatase: 41 U/L (ref 39–117)
BUN: 13 mg/dL (ref 6–23)
CO2: 24 mEq/L (ref 19–32)
Calcium: 8.8 mg/dL (ref 8.4–10.5)
Chloride: 106 mEq/L (ref 96–112)
Creatinine, Ser: 0.78 mg/dL (ref 0.40–1.20)
GFR: 79.65 mL/min (ref >60.00)
Glucose, Bld: 94 mg/dL (ref 70–99)
Potassium: 4.2 mEq/L (ref 3.5–5.1)
Sodium: 137 mEq/L (ref 135–145)
Total Bilirubin: 0.2 mg/dL (ref 0.2–1.2)
Total Protein: 7 g/dL (ref 6.0–8.3)

## 2019-02-18 LAB — TSH: TSH: 2.31 u[IU]/mL (ref 0.35–4.50)

## 2019-02-18 LAB — LIPID PANEL
Cholesterol: 148 mg/dL (ref 0–200)
HDL: 59.3 mg/dL (ref >39.00)
LDL Cholesterol: 68 mg/dL (ref 0–99)
NonHDL: 88.34
Total CHOL/HDL Ratio: 2
Triglycerides: 100 mg/dL (ref 0.0–149.0)
VLDL: 20 mg/dL (ref 0.0–40.0)

## 2019-02-20 ENCOUNTER — Encounter: Payer: Self-pay | Admitting: Family Medicine

## 2019-02-20 ENCOUNTER — Other Ambulatory Visit: Payer: Self-pay

## 2019-02-20 ENCOUNTER — Ambulatory Visit (INDEPENDENT_AMBULATORY_CARE_PROVIDER_SITE_OTHER): Payer: 59 | Admitting: Family Medicine

## 2019-02-20 VITALS — BP 108/78 | HR 83 | Temp 98.5°F | Ht 70.0 in | Wt 181.5 lb

## 2019-02-20 DIAGNOSIS — Z23 Encounter for immunization: Secondary | ICD-10-CM | POA: Diagnosis not present

## 2019-02-20 DIAGNOSIS — Z0000 Encounter for general adult medical examination without abnormal findings: Secondary | ICD-10-CM | POA: Diagnosis not present

## 2019-02-20 NOTE — Patient Instructions (Addendum)
Try a fiber supplement (citrucel) every day to bulk up stool  It not helpful don't continue it  Let me know if the problem does not improve or worsens   Labs look good  Flu shot today   Continue current oral contraceptive   Keep exercising and taking care of yourself

## 2019-02-20 NOTE — Progress Notes (Signed)
Subjective:    Patient ID: Angela Gates, female    DOB: 12/19/1975, 43 y.o.   MRN: [id number]  HPI Here for health maintenance exam and to review chronic medical problems   Working remotely some   Getting outdoors a lot -enjoys it  Feeling ok for the most part  Stressed in early pandemic-work is settling down now    [facility] Readings from Last 3 Encounters:  02/20/19 181 lb 8 oz (82.3 kg)  11/26/18 179 lb (81.2 kg)  02/14/18 184 lb 8 oz (83.7 kg)  exercising regularly  26.04 kg/m   Flu shot given today   Td 7/10  Mammogram 3/20 - asymmetry in R breast and then recall mam/us were normal Self breast exam = no changes or lumps   Pap 8/19 -negative with neg HPV and STD screen  No need for std for std screening  Menses -oc - alesse  Pretty regular/ not heavy  Lasting 5 days  No plans to get pregnant     Cholesterol Lab Results  Component Value Date   CHOL 148 02/18/2019   CHOL 126 02/12/2018   CHOL 119 02/01/2017   Lab Results  Component Value Date   HDL 59.30 02/18/2019   HDL 55.00 02/12/2018   HDL 54.30 02/01/2017   Lab Results  Component Value Date   LDLCALC 68 02/18/2019   LDLCALC 55 02/12/2018   LDLCALC 50 02/01/2017   Lab Results  Component Value Date   TRIG 100.0 02/18/2019   TRIG 78.0 02/12/2018   TRIG 70.0 02/01/2017   Lab Results  Component Value Date   CHOLHDL 2 02/18/2019   CHOLHDL 2 02/12/2018   CHOLHDL 2 02/01/2017   No results found for: LDLDIRECT   Other labs: Results for orders placed or performed in visit on 02/18/19  TSH  Result Value Ref Range   TSH 2.31 0.35 - 4.50 uIU/mL  Lipid panel  Result Value Ref Range   Cholesterol 148 0 - 200 mg/dL   Triglycerides 100.0 0.0 - 149.0 mg/dL   HDL 59.30 >39.00 mg/dL   VLDL 20.0 0.0 - 40.0 mg/dL   LDL Cholesterol 68 0 - 99 mg/dL   Total CHOL/HDL Ratio 2    NonHDL 88.34   Comprehensive metabolic panel  Result Value Ref Range   Sodium 137 135 - 145 mEq/L   Potassium 4.2 3.5 - 5.1  mEq/L   Chloride 106 96 - 112 mEq/L   CO2 24 19 - 32 mEq/L   Glucose, Bld 94 70 - 99 mg/dL   BUN 13 6 - 23 mg/dL   Creatinine, Ser 0.78 0.40 - 1.20 mg/dL   Total Bilirubin 0.2 0.2 - 1.2 mg/dL   Alkaline Phosphatase 41 39 - 117 U/L   AST 17 0 - 37 U/L   ALT 11 0 - 35 U/L   Total Protein 7.0 6.0 - 8.3 g/dL   Albumin 3.9 3.5 - 5.2 g/dL   Calcium 8.8 8.4 - 10.5 mg/dL   GFR 79.65 >60.00 mL/min  CBC with Differential/Platelet  Result Value Ref Range   WBC 7.0 4.0 - 10.5 K/uL   RBC 4.13 3.87 - 5.11 Mil/uL   Hemoglobin 13.0 12.0 - 15.0 g/dL   HCT 38.6 36.0 - 46.0 %   MCV 93.6 78.0 - 100.0 fl   MCHC 33.8 30.0 - 36.0 g/dL   RDW 13.2 11.5 - 15.5 %   Platelets 292.0 150.0 - 400.0 K/uL   Neutrophils Relative % 65.2 43.0 - 77.0 %  Lymphocytes Relative 19.4 12.0 - 46.0 %   Monocytes Relative 8.5 3.0 - 12.0 %   Eosinophils Relative 5.7 (H) 0.0 - 5.0 %   Basophils Relative 1.2 0.0 - 3.0 %   Neutro Abs 4.5 1.4 - 7.7 K/uL   Lymphs Abs 1.3 0.7 - 4.0 K/uL   Monocytes Absolute 0.6 0.1 - 1.0 K/uL   Eosinophils Absolute 0.4 0.0 - 0.7 K/uL   Basophils Absolute 0.1 0.0 - 0.1 K/uL     occ fecal incontinence after eating if urgent bm  Has to be careful when exercising    Patient Active Problem List   Diagnosis Date Noted  . Encounter for screening mammogram for breast cancer 06/02/2014  . Routine general medical examination at a health care facility 05/28/2013  . Encounter for routine gynecological examination 05/28/2013  . ANXIETY 12/02/2008  . ALLERGIC RHINITIS 11/22/2007  . TOBACCO USE, QUIT 11/21/2007   Past Medical History:  Diagnosis Date  . History of chicken pox   . History of UTI    Past Surgical History:  Procedure Laterality Date  . CHOLECYSTECTOMY  1995  . RESECTION DISTAL CLAVICAL  2009   Social History   Tobacco Use  . Smoking status: Former Research scientist (life sciences)  . Smokeless tobacco: Never Used  . Tobacco comment: smoked in college  Substance Use Topics  . Alcohol use: Yes     Alcohol/week: 0.0 standard drinks    Comment: occ-few beers a week  . Drug use: No   Family History  Problem Relation Age of Onset  . Colon cancer Paternal Aunt   . Skin cancer Paternal Grandfather   . Skin cancer Paternal Grandmother   . High blood pressure Paternal Grandmother   . Skin cancer Father   . High blood pressure Father   . Prostate cancer Maternal Grandfather   . Prostate cancer Maternal Uncle   . Depression Sister    No Known Allergies Current Outpatient Medications on File Prior to Visit  Medication Sig Dispense Refill  . fluticasone (FLONASE) 50 MCG/ACT nasal spray Place 1 spray into both nostrils as needed for allergies or rhinitis. 16 g 11  . levonorgestrel-ethinyl estradiol (ALESSE) 0.1-20 MG-MCG tablet Take 1 tablet by mouth daily. 3 Package 1   No current facility-administered medications on file prior to visit.     Review of Systems  Constitutional: Negative for activity change, appetite change, fatigue, fever and unexpected weight change.  HENT: Negative for congestion, ear pain, rhinorrhea, sinus pressure and sore throat.   Eyes: Negative for pain, redness and visual disturbance.  Respiratory: Negative for cough, shortness of breath and wheezing.   Cardiovascular: Negative for chest pain and palpitations.  Gastrointestinal: Negative for abdominal pain, blood in stool, constipation and diarrhea.       Occ urgent soft stools with fecal incontinence (that occurs after bm if active)  Endocrine: Negative for polydipsia and polyuria.  Genitourinary: Negative for dysuria, frequency and urgency.  Musculoskeletal: Negative for arthralgias, back pain and myalgias.  Skin: Negative for pallor and rash.  Allergic/Immunologic: Negative for environmental allergies.  Neurological: Negative for dizziness, syncope and headaches.  Hematological: Negative for adenopathy. Does not bruise/bleed easily.  Psychiatric/Behavioral: Negative for decreased concentration and  dysphoric mood. The patient is not nervous/anxious.        Objective:   Physical Exam Constitutional:      General: She is not in acute distress.    Appearance: Normal appearance. She is well-developed and normal weight. She is not ill-appearing or  diaphoretic.  HENT:     Head: Normocephalic and atraumatic.     Right Ear: Tympanic membrane, ear canal and external ear normal.     Left Ear: Tympanic membrane, ear canal and external ear normal.     Nose: Nose normal. No congestion.     Mouth/Throat:     Mouth: Mucous membranes are moist.     Pharynx: Oropharynx is clear. No posterior oropharyngeal erythema.  Eyes:     General: No scleral icterus.    Extraocular Movements: Extraocular movements intact.     Conjunctiva/sclera: Conjunctivae normal.     Pupils: Pupils are equal, round, and reactive to light.  Neck:     Musculoskeletal: Normal range of motion and neck supple. No neck rigidity or muscular tenderness.     Thyroid: No thyromegaly.     Vascular: No carotid bruit or JVD.  Cardiovascular:     Rate and Rhythm: Normal rate and regular rhythm.     Pulses: Normal pulses.     Heart sounds: Normal heart sounds. No gallop.   Pulmonary:     Effort: Pulmonary effort is normal. No respiratory distress.     Breath sounds: Normal breath sounds. No wheezing.     Comments: Good air exch Chest:     Chest wall: No tenderness.  Abdominal:     General: Bowel sounds are normal. There is no distension or abdominal bruit.     Palpations: Abdomen is soft. There is no mass.     Tenderness: There is no abdominal tenderness.     Hernia: No hernia is present.  Genitourinary:    Comments: Breast exam: No mass, nodules, thickening, tenderness, bulging, retraction, inflamation, nipple discharge or skin changes noted.  No axillary or clavicular LA.     Musculoskeletal: Normal range of motion.        General: No tenderness.     Right lower leg: No edema.     Left lower leg: No edema.   Lymphadenopathy:     Cervical: No cervical adenopathy.  Skin:    General: Skin is warm and dry.     Coloration: Skin is not pale.     Findings: No erythema or rash.     Comments: Dark complexion Few lentigines on trunk  Small dermatofibroma on L lower leg   Neurological:     Mental Status: She is alert. Mental status is at baseline.     Cranial Nerves: No cranial nerve deficit.     Motor: No abnormal muscle tone.     Coordination: Coordination normal.     Gait: Gait normal.     Deep Tendon Reflexes: Reflexes are normal and symmetric. Reflexes normal.  Psychiatric:        Mood and Affect: Mood normal.     Comments: Pleasant            Assessment & Plan:   Problem List Items Addressed This Visit      Other   Routine general medical examination at a health care facility - Primary    Reviewed health habits including diet and exercise and skin cancer prevention Reviewed appropriate screening tests for age  Also reviewed health mt list, fam hx and immunization status , as well as social and family history   See HPI Labs reviewed  Disc use of fiber for IBS symptoms  Flu vaccine today  Refilled OC (pt did not need STD screen)       Relevant Orders   Flu Vaccine QUAD  6+ mos PF IM (Fluarix Quad PF) (Completed)    Other Visit Diagnoses    Need for influenza vaccination       Relevant Orders   Flu Vaccine QUAD 6+ mos PF IM (Fluarix Quad PF) (Completed)

## 2019-02-20 NOTE — Assessment & Plan Note (Signed)
Reviewed health habits including diet and exercise and skin cancer prevention Reviewed appropriate screening tests for age  Also reviewed health mt list, fam hx and immunization status , as well as social and family history   See HPI Labs reviewed  Disc use of fiber for IBS symptoms  Flu vaccine today  Refilled OC (pt did not need STD screen)

## 2019-06-04 ENCOUNTER — Other Ambulatory Visit: Payer: Self-pay | Admitting: Family Medicine

## 2019-11-11 ENCOUNTER — Other Ambulatory Visit: Payer: Self-pay | Admitting: *Deleted

## 2019-11-11 MED ORDER — LEVONORGESTREL-ETHINYL ESTRAD 0.1-20 MG-MCG PO TABS: 1.0000 | ORAL_TABLET | Freq: Every day | ORAL | 1 refills | Status: DC

## 2019-12-12 ENCOUNTER — Other Ambulatory Visit: Payer: Self-pay

## 2019-12-12 ENCOUNTER — Encounter: Payer: Self-pay | Admitting: Family Medicine

## 2019-12-12 ENCOUNTER — Ambulatory Visit: Payer: No Typology Code available for payment source | Admitting: Family Medicine

## 2019-12-12 VITALS — BP 110/70 | HR 62 | Temp 98.0°F | Ht 70.0 in | Wt 199.8 lb

## 2019-12-12 DIAGNOSIS — R350 Frequency of micturition: Secondary | ICD-10-CM | POA: Diagnosis not present

## 2019-12-12 LAB — POC URINALSYSI DIPSTICK (AUTOMATED)
Bilirubin, UA: NEGATIVE
Blood, UA: NEGATIVE
Glucose, UA: NEGATIVE
Ketones, UA: NEGATIVE
Leukocytes, UA: NEGATIVE
Nitrite, UA: NEGATIVE
Protein, UA: NEGATIVE
Spec Grav, UA: 1.015 (ref 1.010–1.025)
Urobilinogen, UA: 0.2 E.U./dL
pH, UA: 7 (ref 5.0–8.0)

## 2019-12-12 NOTE — Patient Instructions (Signed)
Urgency - drink more water - 64 oz a day - try to limit irritants - caffeine, alcohol, artificial sweeteners, hot pepper, and vitamin C-containing foods

## 2019-12-12 NOTE — Progress Notes (Signed)
   Subjective:     Angela Gates is a 44 y.o. female presenting for Urinary Urgency (x 2 weeks )     HPI  #urinary urgency - no burning - symptoms x 2 weeks - no incontinence - able to empty bladder well - no prior issues with incontinence - no diarrhea or constipation  - waking up at night to use the bathroom occasionally - seems worse on Monday  - alcohol 1-2 beers - caffeine - 1 cup coffee day - rare bubbly beverages  - water - better on days when she is exercising  Review of Systems   Social History   Tobacco Use  Smoking Status Former Smoker  Smokeless Tobacco Never Used  Tobacco Comment   smoked in college        Objective:    BP Readings from Last 3 Encounters:  12/12/19 110/70  02/20/19 108/78  02/14/18 104/62   Wt Readings from Last 3 Encounters:  12/12/19 199 lb 12 oz (90.6 kg)  02/20/19 181 lb 8 oz (82.3 kg)  11/26/18 179 lb (81.2 kg)    BP 110/70   Pulse 62   Temp 98 F (36.7 C) (Oral)   Ht 5\' 10"  (1.778 m)   Wt 199 lb 12 oz (90.6 kg)   SpO2 98%   BMI 28.66 kg/m    Physical Exam Constitutional:      General: She is not in acute distress.    Appearance: She is well-developed. She is not diaphoretic.  HENT:     Right Ear: External ear normal.     Left Ear: External ear normal.  Eyes:     Conjunctiva/sclera: Conjunctivae normal.  Cardiovascular:     Rate and Rhythm: Normal rate.  Pulmonary:     Effort: Pulmonary effort is normal.  Musculoskeletal:     Cervical back: Neck supple.  Skin:    General: Skin is warm and dry.     Capillary Refill: Capillary refill takes less than 2 seconds.  Neurological:     Mental Status: She is alert. Mental status is at baseline.  Psychiatric:        Mood and Affect: Mood normal.        Behavior: Behavior normal.      UA: normal     Assessment & Plan:   Problem List Items Addressed This Visit    None    Visit Diagnoses    Urinary frequency    -  Primary   Relevant Orders    POCT Urinalysis Dipstick (Automated) (Completed)     UA without signs of infection Discussed avoiding bladder irritants and hydration Could try cranberry if no improvement Return if not resolving or could consider Urology At this point reassuring that no difficulty emptying and no incontinence    Return if symptoms worsen or fail to improve.  Lesleigh Noe, MD  This visit occurred during the SARS-CoV-2 public health emergency.  Safety protocols were in place, including screening questions prior to the visit, additional usage of staff PPE, and extensive cleaning of exam room while observing appropriate contact time as indicated for disinfecting solutions.

## 2020-02-08 ENCOUNTER — Other Ambulatory Visit: Payer: Self-pay | Admitting: Family Medicine

## 2020-02-19 ENCOUNTER — Other Ambulatory Visit: Payer: 59

## 2020-02-20 ENCOUNTER — Telehealth: Payer: Self-pay | Admitting: Family Medicine

## 2020-02-20 DIAGNOSIS — Z1159 Encounter for screening for other viral diseases: Secondary | ICD-10-CM | POA: Insufficient documentation

## 2020-02-20 DIAGNOSIS — Z0000 Encounter for general adult medical examination without abnormal findings: Secondary | ICD-10-CM

## 2020-02-20 NOTE — Telephone Encounter (Signed)
-----   Message from Ellamae Sia sent at 02/14/2020  3:49 PM EDT ----- Regarding: Lab orders for Friday, 9.3.21 Patient is scheduled for CPX labs, please order future labs, Thanks , Karna Christmas

## 2020-02-21 ENCOUNTER — Other Ambulatory Visit (INDEPENDENT_AMBULATORY_CARE_PROVIDER_SITE_OTHER): Payer: No Typology Code available for payment source

## 2020-02-21 ENCOUNTER — Other Ambulatory Visit: Payer: Self-pay

## 2020-02-21 DIAGNOSIS — Z0000 Encounter for general adult medical examination without abnormal findings: Secondary | ICD-10-CM | POA: Diagnosis not present

## 2020-02-21 DIAGNOSIS — Z1159 Encounter for screening for other viral diseases: Secondary | ICD-10-CM

## 2020-02-21 LAB — CBC WITH DIFFERENTIAL/PLATELET
Basophils Absolute: 0.1 10*3/uL (ref 0.0–0.1)
Basophils Relative: 1.1 % (ref 0.0–3.0)
Eosinophils Absolute: 0.5 10*3/uL (ref 0.0–0.7)
Eosinophils Relative: 8.5 % — ABNORMAL HIGH (ref 0.0–5.0)
HCT: 40.2 % (ref 36.0–46.0)
Hemoglobin: 13.5 g/dL (ref 12.0–15.0)
Lymphocytes Relative: 30 % (ref 12.0–46.0)
Lymphs Abs: 1.7 10*3/uL (ref 0.7–4.0)
MCHC: 33.6 g/dL (ref 30.0–36.0)
MCV: 94.1 fl (ref 78.0–100.0)
Monocytes Absolute: 0.7 10*3/uL (ref 0.1–1.0)
Monocytes Relative: 11.8 % (ref 3.0–12.0)
Neutro Abs: 2.8 10*3/uL (ref 1.4–7.7)
Neutrophils Relative %: 48.6 % (ref 43.0–77.0)
Platelets: 289 10*3/uL (ref 150.0–400.0)
RBC: 4.27 Mil/uL (ref 3.87–5.11)
RDW: 13.1 % (ref 11.5–15.5)
WBC: 5.8 10*3/uL (ref 4.0–10.5)

## 2020-02-21 LAB — COMPREHENSIVE METABOLIC PANEL
ALT: 13 U/L (ref 0–35)
AST: 16 U/L (ref 0–37)
Albumin: 4 g/dL (ref 3.5–5.2)
Alkaline Phosphatase: 44 U/L (ref 39–117)
BUN: 13 mg/dL (ref 6–23)
CO2: 26 mEq/L (ref 19–32)
Calcium: 8.9 mg/dL (ref 8.4–10.5)
Chloride: 105 mEq/L (ref 96–112)
Creatinine, Ser: 0.82 mg/dL (ref 0.40–1.20)
GFR: 74.85 mL/min (ref >60.00)
Glucose, Bld: 91 mg/dL (ref 70–99)
Potassium: 4.1 mEq/L (ref 3.5–5.1)
Sodium: 138 mEq/L (ref 135–145)
Total Bilirubin: 0.3 mg/dL (ref 0.2–1.2)
Total Protein: 7 g/dL (ref 6.0–8.3)

## 2020-02-21 LAB — TSH: TSH: 2.64 u[IU]/mL (ref 0.35–4.50)

## 2020-02-21 LAB — LIPID PANEL
Cholesterol: 153 mg/dL (ref 0–200)
HDL: 57.8 mg/dL (ref >39.00)
LDL Cholesterol: 78 mg/dL (ref 0–99)
NonHDL: 95.52
Total CHOL/HDL Ratio: 3
Triglycerides: 90 mg/dL (ref 0.0–149.0)
VLDL: 18 mg/dL (ref 0.0–40.0)

## 2020-02-25 LAB — HEPATITIS C ANTIBODY
Hepatitis C Ab: NONREACTIVE
SIGNAL TO CUT-OFF: 0.01 (ref <1.00)

## 2020-02-26 ENCOUNTER — Other Ambulatory Visit: Payer: Self-pay

## 2020-02-26 ENCOUNTER — Ambulatory Visit (INDEPENDENT_AMBULATORY_CARE_PROVIDER_SITE_OTHER): Payer: No Typology Code available for payment source | Admitting: Family Medicine

## 2020-02-26 ENCOUNTER — Encounter: Payer: Self-pay | Admitting: Family Medicine

## 2020-02-26 VITALS — BP 104/66 | HR 67 | Temp 96.9°F | Ht 70.0 in | Wt 203.5 lb

## 2020-02-26 DIAGNOSIS — Z0000 Encounter for general adult medical examination without abnormal findings: Secondary | ICD-10-CM

## 2020-02-26 DIAGNOSIS — Z23 Encounter for immunization: Secondary | ICD-10-CM

## 2020-02-26 MED ORDER — LEVONORGESTREL-ETHINYL ESTRAD 0.1-20 MG-MCG PO TABS: 1.0000 | ORAL_TABLET | Freq: Every day | ORAL | 3 refills | Status: DC

## 2020-02-26 NOTE — Assessment & Plan Note (Signed)
Reviewed health habits including diet and exercise and skin cancer prevention Reviewed appropriate screening tests for age  Also reviewed health mt list, fam hx and immunization status , as well as social and family history   See HPI Labs reviewed Flu and Td shots today  Is covid immunized utd pap Will schedule her own mammogram  Hep C screen is negative Reassuring labs, stable

## 2020-02-26 NOTE — Progress Notes (Signed)
Subjective:    Patient ID: Angela Gates, female    DOB: 02/19/1976, 44 y.o.   MRN: [id number]  This visit occurred during the SARS-CoV-2 public health emergency.  Safety protocols were in place, including screening questions prior to the visit, additional usage of staff PPE, and extensive cleaning of exam room while observing appropriate contact time as indicated for disinfecting solutions.    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  Beating herself up about her weight -no time to exercise  Is trying to decrease her hours   Is able to sleep   Drinks beer  No more than 2 per day   She does eat a lot healthier than most people  Some snack foods to get rid of   Her boyfriend is supportive and a vegetarian -that helps   Flu shot today  covid vaccinated  Td 7/10-due for    Mammogram 3/20 -she will schedule Self breast exam -no lumps   Pap 8/19 -neg with neg HPV screen  No new partners  No symptoms  Menses -normal  Taking oral contraceptive-not always exactly predictable     Hep C screen is negative   Cholesterol Lab Results  Component Value Date   CHOL 153 02/21/2020   CHOL 148 02/18/2019   CHOL 126 02/12/2018   Lab Results  Component Value Date   HDL 57.80 02/21/2020   HDL 59.30 02/18/2019   HDL 55.00 02/12/2018   Lab Results  Component Value Date   LDLCALC 78 02/21/2020   LDLCALC 68 02/18/2019   LDLCALC 55 02/12/2018   Lab Results  Component Value Date   TRIG 90.0 02/21/2020   TRIG 100.0 02/18/2019   TRIG 78.0 02/12/2018   Lab Results  Component Value Date   CHOLHDL 3 02/21/2020   CHOLHDL 2 02/18/2019   CHOLHDL 2 02/12/2018   No results found for: LDLDIRECT  Other labs   Results for orders placed or performed in visit on 02/21/20  Hepatitis C antibody  Result Value Ref Range   Hepatitis C Ab NON-REACTIVE NON-REACTI   SIGNAL TO CUT-OFF 0.01 <1.00  TSH  Result Value Ref Range   TSH 2.64 0.35 - 4.50 uIU/mL  Lipid panel  Result Value Ref Range   Cholesterol 153 0 - 200 mg/dL   Triglycerides 90.0 0 - 149 mg/dL   HDL 57.80 >39.00 mg/dL   VLDL 18.0 0.0 - 40.0 mg/dL   LDL Cholesterol 78 0 - 99 mg/dL   Total CHOL/HDL Ratio 3    NonHDL 95.52   Comprehensive metabolic panel  Result Value Ref Range   Sodium 138 135 - 145 mEq/L   Potassium 4.1 3.5 - 5.1 mEq/L   Chloride 105 96 - 112 mEq/L   CO2 26 19 - 32 mEq/L   Glucose, Bld 91 70 - 99 mg/dL   BUN 13 6 - 23 mg/dL   Creatinine, Ser 0.82 0.40 - 1.20 mg/dL   Total Bilirubin 0.3 0.2 - 1.2 mg/dL   Alkaline Phosphatase 44 39 - 117 U/L   AST 16 0 - 37 U/L   ALT 13 0 - 35 U/L   Total Protein  7.0 6.0 - 8.3 g/dL   Albumin 4.0 3.5 - 5.2 g/dL   GFR 74.85 >60.00 mL/min   Calcium 8.9 8.4 - 10.5 mg/dL  CBC with Differential/Platelet  Result Value Ref Range   WBC 5.8 4.0 - 10.5 K/uL   RBC 4.27 3.87 - 5.11 Mil/uL   Hemoglobin 13.5 12.0 - 15.0 g/dL   HCT 40.2 36 - 46 %   MCV 94.1 78.0 - 100.0 fl   MCHC 33.6 30.0 - 36.0 g/dL   RDW 13.1 11.5 - 15.5 %   Platelets 289.0 150 - 400 K/uL   Neutrophils Relative % 48.6 43 - 77 %   Lymphocytes Relative 30.0 12 - 46 %   Monocytes Relative 11.8 3 - 12 %   Eosinophils Relative 8.5 (H) 0 - 5 %   Basophils Relative 1.1 0 - 3 %   Neutro Abs 2.8 1.4 - 7.7 K/uL   Lymphs Abs 1.7 0.7 - 4.0 K/uL   Monocytes Absolute 0.7 0 - 1 K/uL   Eosinophils Absolute 0.5 0 - 0 K/uL   Basophils Absolute 0.1 0 - 0 K/uL     Patient Active Problem List   Diagnosis Date Noted  . Encounter for hepatitis C screening test for low risk patient 02/20/2020  . Encounter for screening mammogram for breast cancer 06/02/2014  . Routine general medical examination at a health care facility 05/28/2013    . Encounter for routine gynecological examination 05/28/2013  . ANXIETY 12/02/2008  . ALLERGIC RHINITIS 11/22/2007  . TOBACCO USE, QUIT 11/21/2007   Past Medical History:  Diagnosis Date  . History of chicken pox   . History of UTI    Past Surgical History:  Procedure Laterality Date  . CHOLECYSTECTOMY  1995  . RESECTION DISTAL CLAVICAL  2009   Social History   Tobacco Use  . Smoking status: Former Research scientist (life sciences)  . Smokeless tobacco: Never Used  . Tobacco comment: smoked in college  Substance Use Topics  . Alcohol use: Yes    Alcohol/week: 0.0 standard drinks    Comment: occ-few beers a week  . Drug use: No   Family History  Problem Relation Age of Onset  . Colon cancer Paternal Aunt   . Skin cancer Paternal Grandfather   . Skin cancer Paternal Grandmother   . High blood pressure Paternal Grandmother   . Skin cancer Father   . High blood pressure Father   . Prostate cancer Maternal Grandfather   . Prostate cancer Maternal Uncle   . Depression Sister    No Known Allergies Current Outpatient Medications on File Prior to Visit  Medication Sig Dispense Refill  . fluticasone (FLONASE) 50 MCG/ACT nasal spray Place 1 spray into both nostrils as needed for allergies or rhinitis. 16 g 11   No current facility-administered medications on file prior to visit.     Review of Systems  Constitutional: Positive for fatigue. Negative for activity change, appetite change, fever and unexpected weight change.  HENT: Negative for congestion, ear pain, rhinorrhea, sinus pressure and sore throat.   Eyes: Negative for pain, redness and visual disturbance.  Respiratory: Negative for cough, shortness of breath and wheezing.   Cardiovascular: Negative for chest pain and palpitations.  Gastrointestinal: Negative for abdominal pain, blood in stool, constipation and diarrhea.  Endocrine: Negative for polydipsia and polyuria.  Genitourinary: Negative for dysuria, frequency and urgency.   Musculoskeletal: Negative for arthralgias, back pain and myalgias.  Skin: Negative for pallor and rash.  Allergic/Immunologic: Negative for environmental  allergies.  Neurological: Negative for dizziness, syncope and headaches.  Hematological: Negative for adenopathy. Does not bruise/bleed easily.  Psychiatric/Behavioral: Negative for decreased concentration and dysphoric mood. The patient is not nervous/anxious.        Objective:   Physical Exam Constitutional:      General: She is not in acute distress.    Appearance: Normal appearance. She is well-developed and normal weight. She is not ill-appearing or diaphoretic.  HENT:     Head: Normocephalic and atraumatic.     Right Ear: Tympanic membrane, ear canal and external ear normal.     Left Ear: Tympanic membrane, ear canal and external ear normal.     Nose: Nose normal. No congestion.     Mouth/Throat:     Mouth: Mucous membranes are moist.     Pharynx: Oropharynx is clear. No posterior oropharyngeal erythema.  Eyes:     General: No scleral icterus.    Extraocular Movements: Extraocular movements intact.     Conjunctiva/sclera: Conjunctivae normal.     Pupils: Pupils are equal, round, and reactive to light.  Neck:     Thyroid: No thyromegaly.     Vascular: No carotid bruit or JVD.  Cardiovascular:     Rate and Rhythm: Normal rate and regular rhythm.     Pulses: Normal pulses.     Heart sounds: Normal heart sounds. No gallop.   Pulmonary:     Effort: Pulmonary effort is normal. No respiratory distress.     Breath sounds: Normal breath sounds. No wheezing.     Comments: Good air exch Chest:     Chest wall: No tenderness.  Abdominal:     General: Bowel sounds are normal. There is no distension or abdominal bruit.     Palpations: Abdomen is soft. There is no mass.     Tenderness: There is no abdominal tenderness.     Hernia: No hernia is present.  Genitourinary:    Comments: Breast exam: No mass, nodules, thickening,  tenderness, bulging, retraction, inflamation, nipple discharge or skin changes noted.  No axillary or clavicular LA.     Musculoskeletal:        General: No tenderness. Normal range of motion.     Cervical back: Normal range of motion and neck supple. No rigidity. No muscular tenderness.     Right lower leg: No edema.     Left lower leg: No edema.  Lymphadenopathy:     Cervical: No cervical adenopathy.  Skin:    General: Skin is warm and dry.     Coloration: Skin is not pale.     Findings: No erythema or rash.     Comments: Some lentigines   Neurological:     Mental Status: She is alert. Mental status is at baseline.     Cranial Nerves: No cranial nerve deficit.     Motor: No abnormal muscle tone.     Coordination: Coordination normal.     Gait: Gait normal.     Deep Tendon Reflexes: Reflexes are normal and symmetric. Reflexes normal.  Psychiatric:        Mood and Affect: Mood normal.        Cognition and Memory: Cognition and memory normal.           Assessment & Plan:   Problem List Items Addressed This Visit      Other   Routine general medical examination at a health care facility - Primary    Reviewed health habits including diet and  exercise and skin cancer prevention Reviewed appropriate screening tests for age  Also reviewed health mt list, fam hx and immunization status , as well as social and family history   See HPI Labs reviewed Flu and Td shots today  Is covid immunized utd pap Will schedule her own mammogram  Hep C screen is negative Reassuring labs, stable       Relevant Orders   Td : Tetanus/diphtheria >7yo Preservative  free (Completed)    Other Visit Diagnoses    Need for influenza vaccination       Relevant Orders   Flu Vaccine QUAD 6+ mos PF IM (Fluarix Quad PF) (Completed)   Need for Td vaccine       Relevant Orders   Td : Tetanus/diphtheria >7yo Preservative  free (Completed)

## 2020-02-26 NOTE — Patient Instructions (Addendum)
Don't forget to schedule your mammogram at the breast center   Work on phasing out snack foods a bit  Try to get most of your carbohydrates from produce (with the exception of white potatoes)  Eat less bread/pasta/rice/snack foods/cereals/sweets and other items from the middle of the grocery store (processed carbs)  Labs look good  Try to incorporate another exercise   Labs look good   Tetanus shot today

## 2020-05-15 ENCOUNTER — Other Ambulatory Visit: Payer: Self-pay | Admitting: Family Medicine

## 2020-12-15 ENCOUNTER — Other Ambulatory Visit: Payer: Self-pay | Admitting: Family Medicine

## 2021-02-22 ENCOUNTER — Telehealth: Payer: Self-pay | Admitting: Family Medicine

## 2021-02-22 DIAGNOSIS — Z0000 Encounter for general adult medical examination without abnormal findings: Secondary | ICD-10-CM

## 2021-02-22 NOTE — Telephone Encounter (Signed)
-----   Message from Ellamae Sia sent at 02/08/2021  9:54 AM EDT ----- Regarding: Lab orders for Tuesday, 9.6.22 Patient is scheduled for CPX labs, please order future labs, Thanks , Karna Christmas

## 2021-02-23 ENCOUNTER — Other Ambulatory Visit (INDEPENDENT_AMBULATORY_CARE_PROVIDER_SITE_OTHER): Payer: Self-pay

## 2021-02-23 ENCOUNTER — Other Ambulatory Visit: Payer: Self-pay

## 2021-02-23 DIAGNOSIS — Z0000 Encounter for general adult medical examination without abnormal findings: Secondary | ICD-10-CM

## 2021-02-23 LAB — CBC WITH DIFFERENTIAL/PLATELET
Basophils Absolute: 0.1 10*3/uL (ref 0.0–0.1)
Basophils Relative: 1.9 % (ref 0.0–3.0)
Eosinophils Absolute: 0.8 10*3/uL — ABNORMAL HIGH (ref 0.0–0.7)
Eosinophils Relative: 17.5 % — ABNORMAL HIGH (ref 0.0–5.0)
HCT: 39.5 % (ref 36.0–46.0)
Hemoglobin: 13.2 g/dL (ref 12.0–15.0)
Lymphocytes Relative: 26.4 % (ref 12.0–46.0)
Lymphs Abs: 1.2 10*3/uL (ref 0.7–4.0)
MCHC: 33.4 g/dL (ref 30.0–36.0)
MCV: 93.8 fl (ref 78.0–100.0)
Monocytes Absolute: 0.6 10*3/uL (ref 0.1–1.0)
Monocytes Relative: 12.1 % — ABNORMAL HIGH (ref 3.0–12.0)
Neutro Abs: 2 10*3/uL (ref 1.4–7.7)
Neutrophils Relative %: 42.1 % — ABNORMAL LOW (ref 43.0–77.0)
Platelets: 250 10*3/uL (ref 150.0–400.0)
RBC: 4.21 Mil/uL (ref 3.87–5.11)
RDW: 12.9 % (ref 11.5–15.5)
WBC: 4.7 10*3/uL (ref 4.0–10.5)

## 2021-02-23 LAB — COMPREHENSIVE METABOLIC PANEL
ALT: 27 U/L (ref 0–35)
AST: 25 U/L (ref 0–37)
Albumin: 4.1 g/dL (ref 3.5–5.2)
Alkaline Phosphatase: 55 U/L (ref 39–117)
BUN: 14 mg/dL (ref 6–23)
CO2: 25 mEq/L (ref 19–32)
Calcium: 9 mg/dL (ref 8.4–10.5)
Chloride: 105 mEq/L (ref 96–112)
Creatinine, Ser: 0.88 mg/dL (ref 0.40–1.20)
GFR: 78.15 mL/min (ref >60.00)
Glucose, Bld: 97 mg/dL (ref 70–99)
Potassium: 4 mEq/L (ref 3.5–5.1)
Sodium: 139 mEq/L (ref 135–145)
Total Bilirubin: 0.6 mg/dL (ref 0.2–1.2)
Total Protein: 6.8 g/dL (ref 6.0–8.3)

## 2021-02-23 LAB — TSH: TSH: 2.22 u[IU]/mL (ref 0.35–5.50)

## 2021-02-23 LAB — LIPID PANEL
Cholesterol: 130 mg/dL (ref 0–200)
HDL: 44 mg/dL (ref >39.00)
LDL Cholesterol: 73 mg/dL (ref 0–99)
NonHDL: 85.98
Total CHOL/HDL Ratio: 3
Triglycerides: 63 mg/dL (ref 0.0–149.0)
VLDL: 12.6 mg/dL (ref 0.0–40.0)

## 2021-03-01 ENCOUNTER — Other Ambulatory Visit: Payer: Self-pay

## 2021-03-01 ENCOUNTER — Ambulatory Visit (INDEPENDENT_AMBULATORY_CARE_PROVIDER_SITE_OTHER): Payer: No Typology Code available for payment source | Admitting: Family Medicine

## 2021-03-01 ENCOUNTER — Encounter: Payer: Self-pay | Admitting: Family Medicine

## 2021-03-01 ENCOUNTER — Other Ambulatory Visit (HOSPITAL_COMMUNITY)
Admission: RE | Admit: 2021-03-01 | Discharge: 2021-03-01 | Disposition: A | Discharge status: Home / Self Care | Payer: No Typology Code available for payment source | Source: Ambulatory Visit | Attending: Family Medicine | Admitting: Family Medicine

## 2021-03-01 VITALS — BP 110/60 | HR 64 | Temp 97.6°F | Ht 70.0 in | Wt 199.0 lb

## 2021-03-01 DIAGNOSIS — Z0000 Encounter for general adult medical examination without abnormal findings: Secondary | ICD-10-CM | POA: Diagnosis not present

## 2021-03-01 DIAGNOSIS — Z1231 Encounter for screening mammogram for malignant neoplasm of breast: Secondary | ICD-10-CM | POA: Diagnosis not present

## 2021-03-01 DIAGNOSIS — Z01419 Encounter for gynecological examination (general) (routine) without abnormal findings: Secondary | ICD-10-CM | POA: Diagnosis not present

## 2021-03-01 DIAGNOSIS — Z23 Encounter for immunization: Secondary | ICD-10-CM | POA: Diagnosis not present

## 2021-03-01 DIAGNOSIS — Z1211 Encounter for screening for malignant neoplasm of colon: Secondary | ICD-10-CM | POA: Diagnosis not present

## 2021-03-01 MED ORDER — FLUCONAZOLE 150 MG PO TABS: ORAL_TABLET | ORAL | 0 refills | Status: DC

## 2021-03-01 MED ORDER — LEVONORGESTREL-ETHINYL ESTRAD 0.1-20 MG-MCG PO TABS: 1.0000 | ORAL_TABLET | Freq: Every day | ORAL | 3 refills | Status: DC

## 2021-03-01 NOTE — Progress Notes (Signed)
Subjective:    Patient ID: Royale Alfred, female    DOB: [birth year], 45 y.o.   MRN: [id number]  This visit occurred during the SARS-CoV-2 public health emergency.  Safety protocols were in place, including screening questions prior to the visit, additional usage of staff PPE, and extensive cleaning of exam room while observing appropriate contact time as indicated for disinfecting solutions.   HPI Here for health maintenance exam and to review chronic medical problems    Wt Readings from Last 3 Encounters:  03/01/21 199 lb (90.3 kg)  02/26/20 203 lb 8 oz (92.3 kg)  12/12/19 199 lb 12 oz (90.6 kg)   28.55 kg/m Doing a lot lately  Weight went up and she decided to get healthier She just lost 20 lb and feels better   Was intermittent fasting and exercising 1-11/2 h per day Then felt a little weak  Then went to a class to teach her how to eat (more fatty meats than she is used to)  Lots of protein -about 2 weeks  3 meals and 3 snacks , then she stopped the snacks  Adjusting this now   She did buy a supplement -with caffeine    Colon cancer screening -interested  Her father has lots of polyps  P aunt had colon cancer She more bowel movements lately    Mammogram 3/20 - she used to get them regularly before the pandemic  Goes to the breast center Self breast exam -no lumps   Pap 8/19 with neg HPV screen STD screen- declines /monogamous  Only one partner  [place] 1/20 for OC She skipped a period last month with weight loss  No missed pills  Now sets an alarm also   She tends to have some vaginal itching    Covid immunized  Flu shot -today  Td 9/21   BP Readings from Last 3 Encounters:  03/01/21 110/60  02/26/20 104/66  12/12/19 110/70   Pulse Readings from Last 3 Encounters:  03/01/21 64  02/26/20 67  12/12/19 62   Cholesterol Lab Results  Component Value Date   CHOL 130 02/23/2021   CHOL 153 02/21/2020   CHOL 148 02/18/2019   Lab Results   Component Value Date   HDL 44.00 02/23/2021   HDL 57.80 02/21/2020   HDL 59.30 02/18/2019   Lab Results  Component Value Date   LDLCALC 73 02/23/2021   LDLCALC 78 02/21/2020   LDLCALC 68 02/18/2019   Lab Results  Component Value Date   TRIG 63.0 02/23/2021   TRIG 90.0 02/21/2020   TRIG 100.0 02/18/2019   Lab Results  Component Value Date   CHOLHDL 3 02/23/2021   CHOLHDL 3 02/21/2020   CHOLHDL 2 02/18/2019   No results found for: LDLDIRECT    Lab Results  Component Value Date   WBC 4.7 02/23/2021   HGB 13.2 02/23/2021   HCT 39.5 02/23/2021   MCV 93.8 02/23/2021   PLT 250.0 02/23/2021   Eosinophils are high at 17.5  Allergies are bad lately  Uses flonase Sneezes a lot  (started in may and June)      Lab Results  Component Value Date   CREATININE 0.88 02/23/2021   BUN 14 02/23/2021   NA 139 02/23/2021   K 4.0 02/23/2021   CL 105 02/23/2021   CO2 25 02/23/2021   Lab Results  Component Value Date   TSH 2.22 02/23/2021   Lab Results  Component Value Date   ALT 27 02/23/2021  AST 25 02/23/2021   ALKPHOS 55 02/23/2021   BILITOT 0.6 02/23/2021    Patient Active Problem List   Diagnosis Date Noted   Colon cancer screening 03/01/2021   Encounter for hepatitis C screening test for low risk patient 02/20/2020   Encounter for screening mammogram for breast cancer 06/02/2014   Routine general medical examination at a health care facility 05/28/2013   Encounter for routine gynecological examination 05/28/2013   ANXIETY 12/02/2008   ALLERGIC RHINITIS 11/22/2007   TOBACCO USE, QUIT 11/21/2007   Past Medical History:  Diagnosis Date   History of chicken pox    History of UTI    Past Surgical History:  Procedure Laterality Date   [place]  2009   Social History   Tobacco Use   Smoking status: Former   Smokeless tobacco: Never   Tobacco comments:    smoked in college  Substance Use Topics   Alcohol use:  Yes    Alcohol/week: 0.0 standard drinks    Comment: occ-few beers a week   Drug use: No   Family History  Problem Relation Age of Onset   Colon cancer Paternal Aunt    Skin cancer Paternal Grandfather    Skin cancer Paternal Grandmother    High blood pressure Paternal Grandmother    Skin cancer Father    High blood pressure Father    Prostate cancer Maternal Grandfather    Prostate cancer Maternal Uncle    Depression Sister    No Known Allergies Current Outpatient Medications on File Prior to Visit  Medication Sig Dispense Refill   fluticasone (FLONASE) 50 MCG/ACT nasal spray Place 1 spray into both nostrils as needed for allergies or rhinitis. 16 g 11   No current facility-administered medications on file prior to visit.     Review of Systems  Constitutional:  Negative for activity change, appetite change, fatigue, fever and unexpected weight change.  HENT:  Negative for congestion, ear pain, rhinorrhea, sinus pressure and sore throat.   Eyes:  Negative for pain, redness and visual disturbance.  Respiratory:  Negative for cough, shortness of breath and wheezing.   Cardiovascular:  Negative for chest pain and palpitations.  Gastrointestinal:  Negative for abdominal pain, blood in stool, constipation and diarrhea.  Endocrine: Negative for polydipsia and polyuria.  Genitourinary:  Negative for dysuria, frequency and urgency.  Musculoskeletal:  Negative for arthralgias, back pain and myalgias.  Skin:  Negative for pallor and rash.  Allergic/Immunologic: Negative for environmental allergies.  Neurological:  Negative for dizziness, syncope and headaches.  Hematological:  Negative for adenopathy. Does not bruise/bleed easily.  Psychiatric/Behavioral:  Negative for decreased concentration and dysphoric mood. The patient is not nervous/anxious.       Objective:   Physical Exam Constitutional:      General: She is not in acute distress.    Appearance: Normal appearance. She is  well-developed and normal weight. She is not ill-appearing or diaphoretic.  HENT:     Head: Normocephalic and atraumatic.     Right Ear: Tympanic membrane, ear canal and external ear normal.     Left Ear: Tympanic membrane, ear canal and external ear normal.     Nose: Nose normal. No congestion.     Mouth/Throat:     Mouth: Mucous membranes are moist.     Pharynx: Oropharynx is clear. No posterior oropharyngeal erythema.  Eyes:     General: No scleral icterus.    Extraocular Movements: Extraocular movements  intact.     Conjunctiva/sclera: Conjunctivae normal.     Pupils: Pupils are equal, round, and reactive to light.  Neck:     Thyroid: No thyromegaly.     Vascular: No carotid bruit or JVD.  Cardiovascular:     Rate and Rhythm: Normal rate and regular rhythm.     Pulses: Normal pulses.     Heart sounds: Normal heart sounds.    No gallop.  Pulmonary:     Effort: Pulmonary effort is normal. No respiratory distress.     Breath sounds: Normal breath sounds. No wheezing.     Comments: Good air exch Chest:     Chest wall: No tenderness.  Abdominal:     General: Bowel sounds are normal. There is no distension or abdominal bruit.     Palpations: Abdomen is soft. There is no mass.     Tenderness: There is no abdominal tenderness.     Hernia: No hernia is present.  Genitourinary:    Pubic Area: No rash.      Comments: Breast exam: No mass, nodules, thickening, tenderness, bulging, retraction, inflamation, nipple discharge or skin changes noted.  No axillary or clavicular LA.                   Anus appears normal w/o hemorrhoids or masses       External genitalia : nl appearance and hair distribution/no lesions       Urethral meatus : nl size, no lesions or prolapse       Urethra: no masses, tenderness or scarring      Bladder : no masses or tenderness       Vagina: nl general appearance, no lesions, no significant cystocele  or rectocele   scant white vaginal d/c consistent with  yeast       Cervix: no lesions/ discharge or friability (scant spotting with use of brush for pap)      Uterus: nl size, contour, position, and mobility (not fixed) , non tender      Adnexa : no masses, tenderness, enlargement or nodularity           Musculoskeletal:        General: No tenderness. Normal range of motion.     Cervical back: Normal range of motion and neck supple. No rigidity. No muscular tenderness.     Right lower leg: No edema.     Left lower leg: No edema.  Lymphadenopathy:     Cervical: No cervical adenopathy.  Skin:    General: Skin is warm and dry.     Coloration: Skin is not pale.     Findings: No erythema or rash.  Neurological:     Mental Status: She is alert. Mental status is at baseline.     Cranial Nerves: No cranial nerve deficit.     Motor: No abnormal muscle tone.     Coordination: Coordination normal.     Gait: Gait normal.     Deep Tendon Reflexes: Reflexes are normal and symmetric. Reflexes normal.  Psychiatric:        Mood and Affect: Mood normal.        Cognition and Memory: Cognition and memory normal.          Assessment & Plan:   Problem List Items Addressed This Visit       Other   Routine general medical examination at a health care facility - Primary    Reviewed health habits including diet and exercise  and skin cancer prevention Reviewed appropriate screening tests for age  Also reviewed health mt list, fam hx and immunization status , as well as social and family history   See hpi Labs reviewed  Commended on habit change and weight loss  GI referral done for screening colonoscopy  Pt given # to schedule her mammogram  covid immunized Flu shot given  Cholesterol is stable  Noted elevated eosinophil # (in setting of environmental allergies) and will plan a re check      Encounter for routine gynecological examination    Routine exam Some itching and also vaginal d/c consistent with yeast (tx with diflucan times 2)   Pap done  No complaints Plans to continue current 1/20 OC Missed period likely due to wt loss - inst to alert [place] if no menses in 3-6 mo      Relevant Orders   Cytology - PAP([place])   Encounter for screening mammogram for breast cancer    Given info to schedule mammogram-pt plans to  Encouraged self exams  No lumps on exam today      Colon cancer screening    D/w patient [id number] for colon cancer screening, including IFOB vs. colonoscopy.  Risks and benefits of both were discussed and patient voiced understanding.  Pt elects for: colonoscopy  Referral done  Will need to see if insurance covers  Father has h/o polyps       Relevant Orders   Ambulatory referral to Gastroenterology   Other Visit Diagnoses     Need for immunization against influenza       Relevant Orders   Flu Vaccine QUAD 71moIM (Fluarix, Fluzone & Alfiuria Quad PF) (Completed)

## 2021-03-01 NOTE — Patient Instructions (Addendum)
I placed a referral for colonoscopy  You will get a call   Flu shot today  Please schedule your mammogram   I want to check eosinophil count again in 2 weeks  Schedule a lab appointment - non fasting   Take care of yourself  Great job with exercise and weight loss Let us know if you do not start period in 3-6 months   Take diflucan for yeast      Influenza (Flu) Vaccine (Inactivated or Recombinant): What You Need to Know 1. Why get vaccinated? Influenza vaccine can prevent influenza (flu). Flu is a contagious disease that spreads around the [place] every year, usually between October and May. Anyone can get the flu, but it is more dangerous for some people. Infants and young children, people 72 years and older, pregnant people, and people with certain health conditions or a weakened immune system are at greatest risk of flu complications. Pneumonia, bronchitis, sinus infections, and ear infections are examples of flu-related complications. If you have a medical condition, such as heart disease, cancer, or diabetes, flu can make it worse. Flu can cause fever and chills, sore throat, muscle aches, fatigue, cough, headache, and runny or stuffy nose. Some people may have vomiting and diarrhea, though this is more common in children than adults. In an average year, thousands of people in the [place] die from flu, and many more are hospitalized. Flu vaccine prevents millions of illnesses and flu-related visits to the doctor each year. 2. Influenza vaccines CDC recommends everyone 6 months and older get vaccinated every flu season. Children 6 months through 41 years of age may need 2 doses during a single flu season. Everyone else needs only 1 dose each flu season. It takes about 2 weeks for protection to develop after vaccination. There are many flu viruses, and they are always changing. Each year a new flu vaccine is made to protect against the influenza viruses believed to be  likely to cause disease in the upcoming flu season. Even when the vaccine doesn't exactly match these viruses, it may still provide some protection. Influenza vaccine does not cause flu. Influenza vaccine may be given at the same time as other vaccines. 3. Talk with your health care provider Tell your vaccination provider if the person getting the vaccine: Has had an allergic reaction after a previous dose of influenza vaccine, or has any severe, life-threatening allergies Has ever had Guillain-Barr Syndrome (also called "GBS") In some cases, your health care provider may decide to postpone influenza vaccination until a future visit. Influenza vaccine can be administered at any time during pregnancy. People who are or will be pregnant during influenza season should receive inactivated influenza vaccine. People with minor illnesses, such as a cold, may be vaccinated. People who are moderately or severely ill should usually wait until they recover before getting influenza vaccine. Your health care provider can give you more information. 4. Risks of a vaccine reaction Soreness, redness, and swelling where the shot is given, fever, muscle aches, and headache can happen after influenza vaccination. There may be a very small increased risk of Guillain-Barr Syndrome (GBS) after inactivated influenza vaccine (the flu shot). Young children who get the flu shot along with pneumococcal vaccine (PCV13) and/or DTaP vaccine at the same time might be slightly more likely to have a seizure caused by fever. Tell your health care provider if a child who is getting flu vaccine has ever had a seizure. People sometimes faint after medical procedures,  including vaccination. Tell your provider if you feel dizzy or have vision changes or ringing in the ears. As with any medicine, there is a very remote chance of a vaccine causing a severe allergic reaction, other serious injury, or death. 5. What if there is a serious  problem? An allergic reaction could occur after the vaccinated person leaves the clinic. If you see signs of a severe allergic reaction (hives, swelling of the face and throat, difficulty breathing, a fast heartbeat, dizziness, or weakness), call 9-1-1 and get the person to the nearest hospital. For other signs that concern you, call your health care provider. Adverse reactions should be reported to the Vaccine Adverse Event Reporting System (VAERS). Your health care provider will usually file this report, or you can do it yourself. Visit the VAERS website at www.vaers.SamedayNews.es or call (409) 780-3638. VAERS is only for reporting reactions, and VAERS staff members do not give medical advice. 6. The National Vaccine Injury Compensation Program The [employer] Vaccine Injury Compensation Program (VICP) is a federal program that was created to compensate people who may have been injured by certain vaccines. Claims regarding alleged injury or death due to vaccination have a time limit for filing, which may be as short as two years. Visit the VICP website at GoldCloset.com.ee or call 4082987387 to learn about the program and about filing a claim. 7. How can I learn more? Ask your health care provider. Call your local or state health department. Visit the website of the Food and Drug Administration (FDA) for vaccine package inserts and additional information at TraderRating.uy. Contact the Centers for Disease Control and Prevention (CDC): Call 989-266-2848 (1-800-CDC-INFO) or Visit CDC's website at https://gibson.com/. Vaccine Information Statement Inactivated Influenza Vaccine (01/24/2020) This information is not intended to replace advice given to you by your health care provider. Make sure you discuss any questions you have with your health care provider. Document Revised: 03/12/2020 Document Reviewed: 03/12/2020 Elsevier Patient Education  2022 [employer].

## 2021-03-01 NOTE — Assessment & Plan Note (Signed)
Given info to schedule mammogram-pt plans to  Encouraged self exams  No lumps on exam today

## 2021-03-01 NOTE — Assessment & Plan Note (Signed)
D/w patient [id number] for colon cancer screening, including IFOB vs. colonoscopy.  Risks and benefits of both were discussed and patient voiced understanding.  Pt elects for: colonoscopy  Referral done  Will need to see if insurance covers  Father has h/o polyps

## 2021-03-01 NOTE — Assessment & Plan Note (Signed)
Routine exam Some itching and also vaginal d/c consistent with yeast (tx with diflucan times 2)  Pap done  No complaints Plans to continue current 1/20 OC Missed period likely due to wt loss - inst to alert [place] if no menses in 3-6 mo

## 2021-03-01 NOTE — Assessment & Plan Note (Signed)
Reviewed health habits including diet and exercise and skin cancer prevention Reviewed appropriate screening tests for age  Also reviewed health mt list, fam hx and immunization status , as well as social and family history   See hpi Labs reviewed  Commended on habit change and weight loss  GI referral done for screening colonoscopy  Pt given # to schedule her mammogram  covid immunized Flu shot given  Cholesterol is stable  Noted elevated eosinophil # (in setting of environmental allergies) and will plan a re check

## 2021-03-09 ENCOUNTER — Other Ambulatory Visit: Payer: Self-pay | Admitting: Family Medicine

## 2021-03-09 LAB — CYTOLOGY - PAP
Comment: NEGATIVE
Diagnosis: NEGATIVE
Diagnosis: REACTIVE
High risk HPV: NEGATIVE

## 2021-03-09 NOTE — Telephone Encounter (Signed)
CPE was on 03/01/21, I wasn't here that day but looks like you did give her 2 tabs of diflucan then, and ?? If you did a pap smear because it's still not resulted

## 2021-03-09 NOTE — Telephone Encounter (Signed)
If the diflucan did not help then I want to try metro gel vaginal  This may be bacterial vaginosis instead of yeast  Pended, please send to the pharmacy of choice  F/u if no improvement

## 2021-03-09 NOTE — Addendum Note (Signed)
Addended by: Loura Pardon A on: 03/09/2021 04:48 PM   Modules accepted: Orders

## 2021-03-09 NOTE — Telephone Encounter (Signed)
Pap smear results are back.  Left VM requesting pt to call the office back

## 2021-03-09 NOTE — Telephone Encounter (Signed)
  Encourage patient to contact the pharmacy for refills or they can request refills through [facility]:  Please schedule appointment if longer than 1 year  NEXT APPOINTMENT DATE:  MEDICATION:fluconazole (DIFLUCAN) 150 MG tablet  Is the patient out of medication?   PHARMACY:[id number] - [place], [place] - [address]   Let patient know to contact pharmacy at the end of the day to make sure medication is ready.  Please notify patient to allow 48-72 hours to process  CLINICAL FILLS OUT ALL BELOW:   LAST REFILL:  QTY:  REFILL DATE:    OTHER COMMENTS:    Okay for refill?  Please advise

## 2021-03-09 NOTE — Telephone Encounter (Signed)
Patient called back and I conveyed Tower's message.  Patient continues with same symptoms, discharge/itch, no improvement with the medication.   Requests further treatment.

## 2021-03-09 NOTE — Telephone Encounter (Signed)
I did the pap, there was suspicious discharge so went ahead and treated.   How are vaginal symptoms?

## 2021-03-10 MED ORDER — METRONIDAZOLE 0.75 % VA GEL: 1.0000 | Freq: Two times a day (BID) | VAGINAL | 0 refills | Status: DC

## 2021-03-10 NOTE — Addendum Note (Signed)
Addended by: Tammi Sou on: 03/10/2021 04:46 PM   Modules accepted: Orders

## 2021-03-10 NOTE — Telephone Encounter (Signed)
Pt notified of Dr. Tower's comments and Rx sent to pharmacy  

## 2021-03-12 ENCOUNTER — Other Ambulatory Visit: Payer: Self-pay | Admitting: Family Medicine

## 2021-03-16 ENCOUNTER — Telehealth: Payer: Self-pay | Admitting: Family Medicine

## 2021-03-16 DIAGNOSIS — Z0000 Encounter for general adult medical examination without abnormal findings: Secondary | ICD-10-CM

## 2021-03-16 NOTE — Telephone Encounter (Signed)
-----   Message from Ellamae Sia sent at 03/03/2021 11:07 AM EDT ----- Regarding: Lab orders for Thursday, 9.29.22 Patient is scheduled for CPX labs, please order future labs, Thanks , Karna Christmas

## 2021-03-18 ENCOUNTER — Other Ambulatory Visit: Payer: No Typology Code available for payment source

## 2021-03-23 ENCOUNTER — Other Ambulatory Visit (INDEPENDENT_AMBULATORY_CARE_PROVIDER_SITE_OTHER): Payer: No Typology Code available for payment source

## 2021-03-23 ENCOUNTER — Other Ambulatory Visit: Payer: Self-pay

## 2021-03-23 DIAGNOSIS — Z0000 Encounter for general adult medical examination without abnormal findings: Secondary | ICD-10-CM | POA: Diagnosis not present

## 2021-03-23 LAB — LIPID PANEL
Cholesterol: 144 mg/dL (ref 0–200)
HDL: 49.5 mg/dL (ref >39.00)
LDL Cholesterol: 81 mg/dL (ref 0–99)
NonHDL: 94.31
Total CHOL/HDL Ratio: 3
Triglycerides: 68 mg/dL (ref 0.0–149.0)
VLDL: 13.6 mg/dL (ref 0.0–40.0)

## 2021-03-23 LAB — CBC WITH DIFFERENTIAL/PLATELET
Basophils Absolute: 0.1 10*3/uL (ref 0.0–0.1)
Basophils Relative: 2.9 % (ref 0.0–3.0)
Eosinophils Absolute: 0.4 10*3/uL (ref 0.0–0.7)
Eosinophils Relative: 9.8 % — ABNORMAL HIGH (ref 0.0–5.0)
HCT: 40.7 % (ref 36.0–46.0)
Hemoglobin: 13.5 g/dL (ref 12.0–15.0)
Lymphocytes Relative: 26.4 % (ref 12.0–46.0)
Lymphs Abs: 1.2 10*3/uL (ref 0.7–4.0)
MCHC: 33.2 g/dL (ref 30.0–36.0)
MCV: 93.7 fl (ref 78.0–100.0)
Monocytes Absolute: 0.5 10*3/uL (ref 0.1–1.0)
Monocytes Relative: 10.5 % (ref 3.0–12.0)
Neutro Abs: 2.3 10*3/uL (ref 1.4–7.7)
Neutrophils Relative %: 50.4 % (ref 43.0–77.0)
Platelets: 265 10*3/uL (ref 150.0–400.0)
RBC: 4.34 Mil/uL (ref 3.87–5.11)
RDW: 12.7 % (ref 11.5–15.5)
WBC: 4.6 10*3/uL (ref 4.0–10.5)

## 2021-03-23 LAB — COMPREHENSIVE METABOLIC PANEL
ALT: 17 U/L (ref 0–35)
AST: 20 U/L (ref 0–37)
Albumin: 4.1 g/dL (ref 3.5–5.2)
Alkaline Phosphatase: 59 U/L (ref 39–117)
BUN: 14 mg/dL (ref 6–23)
CO2: 26 mEq/L (ref 19–32)
Calcium: 9.1 mg/dL (ref 8.4–10.5)
Chloride: 105 mEq/L (ref 96–112)
Creatinine, Ser: 0.84 mg/dL (ref 0.40–1.20)
GFR: 82.59 mL/min (ref >60.00)
Glucose, Bld: 96 mg/dL (ref 70–99)
Potassium: 4.3 mEq/L (ref 3.5–5.1)
Sodium: 138 mEq/L (ref 135–145)
Total Bilirubin: 0.4 mg/dL (ref 0.2–1.2)
Total Protein: 6.8 g/dL (ref 6.0–8.3)

## 2021-03-23 LAB — TSH: TSH: 1.98 u[IU]/mL (ref 0.35–5.50)

## 2021-03-24 ENCOUNTER — Telehealth: Payer: Self-pay | Admitting: Family Medicine

## 2021-03-24 NOTE — Telephone Encounter (Signed)
Completed in another encounter.

## 2021-03-24 NOTE — Telephone Encounter (Signed)
Pt returning call

## 2021-06-30 ENCOUNTER — Other Ambulatory Visit: Payer: Self-pay | Admitting: Family Medicine

## 2021-06-30 DIAGNOSIS — Z1231 Encounter for screening mammogram for malignant neoplasm of breast: Secondary | ICD-10-CM

## 2021-07-01 ENCOUNTER — Other Ambulatory Visit: Payer: Self-pay

## 2021-07-01 ENCOUNTER — Ambulatory Visit
Admission: RE | Admit: 2021-07-01 | Discharge: 2021-07-01 | Disposition: A | Discharge status: Home / Self Care | Payer: No Typology Code available for payment source | Source: Ambulatory Visit | Attending: Family Medicine | Admitting: Family Medicine

## 2021-07-01 DIAGNOSIS — Z1231 Encounter for screening mammogram for malignant neoplasm of breast: Secondary | ICD-10-CM

## 2021-11-22 ENCOUNTER — Encounter: Payer: Self-pay | Admitting: Family Medicine

## 2021-11-22 ENCOUNTER — Ambulatory Visit: Payer: No Typology Code available for payment source | Admitting: Family Medicine

## 2021-11-22 VITALS — BP 116/70 | HR 66 | Temp 97.9°F | Ht 70.0 in | Wt 216.6 lb

## 2021-11-22 DIAGNOSIS — M79622 Pain in left upper arm: Secondary | ICD-10-CM | POA: Insufficient documentation

## 2021-11-22 DIAGNOSIS — M2560 Stiffness of unspecified joint, not elsewhere classified: Secondary | ICD-10-CM | POA: Diagnosis not present

## 2021-11-22 DIAGNOSIS — R5382 Chronic fatigue, unspecified: Secondary | ICD-10-CM

## 2021-11-22 DIAGNOSIS — S30861A Insect bite (nonvenomous) of abdominal wall, initial encounter: Secondary | ICD-10-CM | POA: Insufficient documentation

## 2021-11-22 DIAGNOSIS — W57XXXS Bitten or stung by nonvenomous insect and other nonvenomous arthropods, sequela: Secondary | ICD-10-CM | POA: Diagnosis not present

## 2021-11-22 DIAGNOSIS — R5383 Other fatigue: Secondary | ICD-10-CM | POA: Insufficient documentation

## 2021-11-22 DIAGNOSIS — S30861S Insect bite (nonvenomous) of abdominal wall, sequela: Secondary | ICD-10-CM

## 2021-11-22 LAB — CBC WITH DIFFERENTIAL/PLATELET
Basophils Absolute: 0.1 10*3/uL (ref 0.0–0.1)
Basophils Relative: 1.2 % (ref 0.0–3.0)
Eosinophils Absolute: 0.5 10*3/uL (ref 0.0–0.7)
Eosinophils Relative: 9.7 % — ABNORMAL HIGH (ref 0.0–5.0)
HCT: 42.3 % (ref 36.0–46.0)
Hemoglobin: 14.1 g/dL (ref 12.0–15.0)
Lymphocytes Relative: 25.4 % (ref 12.0–46.0)
Lymphs Abs: 1.3 10*3/uL (ref 0.7–4.0)
MCHC: 33.3 g/dL (ref 30.0–36.0)
MCV: 92.6 fl (ref 78.0–100.0)
Monocytes Absolute: 0.5 10*3/uL (ref 0.1–1.0)
Monocytes Relative: 10.3 % (ref 3.0–12.0)
Neutro Abs: 2.8 10*3/uL (ref 1.4–7.7)
Neutrophils Relative %: 53.4 % (ref 43.0–77.0)
Platelets: 341 10*3/uL (ref 150.0–400.0)
RBC: 4.56 Mil/uL (ref 3.87–5.11)
RDW: 12.5 % (ref 11.5–15.5)
WBC: 5.3 10*3/uL (ref 4.0–10.5)

## 2021-11-22 NOTE — Patient Instructions (Addendum)
Try voltaren gel 1% over the counter for your left arm/shoulder area  Also ice   Let's do some tick labs   Take care of yourself   Watch for rash  Watch for worse headache or fatigue or any joint swelling or redness

## 2021-11-22 NOTE — Assessment & Plan Note (Signed)
Suspect shoulder tendonitis  Pos Neer test and some worsening with overhead raise   Adv use of ice  Avoid triggering activities Trial of voltaren gel F/u if not imp

## 2021-11-22 NOTE — Progress Notes (Signed)
Subjective:    Patient ID: Angela Gates, female    DOB: 07/08/1975, 46 y.o.   MRN: [id number]  HPI Pt presents with concern of lyme dz   Wt Readings from Last 3 Encounters:  11/22/21 216 lb 9.6 oz (98.2 kg)  03/01/21 199 lb (90.3 kg)  02/26/20 203 lb 8 oz (92.3 kg)   31.08 kg/m  2 weeks Stiff neck and arms and back  Muscle pain - worse L upper arm Some achy joints  Ear pain  Headache -dull (feels different)  Weird sensation in back of head  Fatigue a--all the time   No rashes  No bullseye  No fever  Some stress-could add to that  Had a cold several weeks ago- ears ache a bit after that   Found a tick on her-crawling on her/ does not recall a recent bite  Bitten in prior years  In area with lots of ticks   Some tiny deer ticks Occ bigger ticks   Has animals  Treats both of them    Patient Active Problem List   Diagnosis Date Noted   Tick bite of abdomen 11/22/2021   Joint stiffness 11/22/2021   Fatigue 11/22/2021   Left upper arm pain 11/22/2021   Colon cancer screening 03/01/2021   Encounter for hepatitis C screening test for low risk patient 02/20/2020   Encounter for screening mammogram for breast cancer 06/02/2014   Routine general medical examination at a health care facility 05/28/2013   Encounter for routine gynecological examination 05/28/2013   ANXIETY 12/02/2008   ALLERGIC RHINITIS 11/22/2007   TOBACCO USE, QUIT 11/21/2007   Past Medical History:  Diagnosis Date   History of chicken pox    History of UTI    Past Surgical History:  Procedure Laterality Date   Concrete  2009   Social History   Tobacco Use   Smoking status: Former   Smokeless tobacco: Never   Tobacco comments:    smoked in college  Vaping Use   Vaping Use: Never used  Substance Use Topics   Alcohol use: Yes    Alcohol/week: 0.0 standard drinks    Comment: occ-few beers a week   Drug use: No   Family History  Problem  Relation Age of Onset   Colon cancer Paternal Aunt    Skin cancer Paternal Grandfather    Skin cancer Paternal Grandmother    High blood pressure Paternal Grandmother    Skin cancer Father    High blood pressure Father    Prostate cancer Maternal Grandfather    Prostate cancer Maternal Uncle    Depression Sister    No Known Allergies Current Outpatient Medications on File Prior to Visit  Medication Sig Dispense Refill   VIENVA 0.1-20 MG-MCG tablet TAKE 1 TABLET BY MOUTH EVERY DAY 84 tablet 3   No current facility-administered medications on file prior to visit.     Review of Systems  Constitutional:  Positive for fatigue. Negative for activity change, appetite change, fever and unexpected weight change.  HENT:  Negative for congestion, ear pain, rhinorrhea, sinus pressure and sore throat.   Eyes:  Negative for pain, redness and visual disturbance.  Respiratory:  Negative for cough, shortness of breath and wheezing.   Cardiovascular:  Negative for chest pain and palpitations.  Gastrointestinal:  Negative for abdominal pain, blood in stool, constipation and diarrhea.  Endocrine: Negative for polydipsia and polyuria.  Genitourinary:  Negative for dysuria, frequency and  urgency.  Musculoskeletal:  Positive for arthralgias, myalgias and neck pain. Negative for back pain and neck stiffness.  Skin:  Negative for pallor and rash.  Allergic/Immunologic: Negative for environmental allergies.  Neurological:  Negative for dizziness, syncope and headaches.  Hematological:  Negative for adenopathy. Does not bruise/bleed easily.  Psychiatric/Behavioral:  Negative for decreased concentration and dysphoric mood. The patient is not nervous/anxious.       Objective:   Physical Exam Constitutional:      General: She is not in acute distress.    Appearance: Normal appearance. She is well-developed. She is obese. She is not ill-appearing or diaphoretic.  HENT:     Head: Normocephalic and  atraumatic.  Eyes:     Conjunctiva/sclera: Conjunctivae normal.     Pupils: Pupils are equal, round, and reactive to light.  Neck:     Thyroid: No thyromegaly.     Vascular: No carotid bruit or JVD.  Cardiovascular:     Rate and Rhythm: Normal rate and regular rhythm.     Heart sounds: Normal heart sounds.    No gallop.  Pulmonary:     Effort: Pulmonary effort is normal. No respiratory distress.     Breath sounds: Normal breath sounds. No wheezing or rales.  Abdominal:     General: There is no distension or abdominal bruit.     Palpations: Abdomen is soft.  Musculoskeletal:     Cervical back: Normal range of motion and neck supple.     Right lower leg: No edema.     Left lower leg: No edema.     Comments: No acute joint changes   L shoulder Pos neer Neg hawkings test  Nl rom with discomfort over 90 deg abduction   Lymphadenopathy:     Cervical: No cervical adenopathy.  Skin:    General: Skin is warm and dry.     Coloration: Skin is not jaundiced or pale.     Findings: No bruising, erythema or rash.  Neurological:     Mental Status: She is alert.     Coordination: Coordination normal.     Deep Tendon Reflexes: Reflexes are normal and symmetric. Reflexes normal.  Psychiatric:        Mood and Affect: Mood normal.          Assessment & Plan:   Problem List Items Addressed This Visit       Musculoskeletal and Integument   Tick bite of abdomen - Primary    Pt has found multiple ticks on her /is outdoors a lot (both deer and dog ticks) Last actual bite was on R abd with no bullseye or rash   Some c/o fatigue and muscle/joint aches No fever or rash   Testing for rmsf and lyme done today  Also cbc Pend results  Reassuring exam       Relevant Orders   Rocky mtn spotted fvr abs pnl(IgG+IgM)   B. Burgdorfi Antibodies   CBC with Differential/Platelet     Other   Fatigue   Relevant Orders   Rocky mtn spotted fvr abs pnl(IgG+IgM)   B. Burgdorfi Antibodies    CBC with Differential/Platelet   Joint stiffness   Relevant Orders   Rocky mtn spotted fvr abs pnl(IgG+IgM)   B. Burgdorfi Antibodies   CBC with Differential/Platelet   Left upper arm pain    Suspect shoulder tendonitis  Pos Neer test and some worsening with overhead raise   Adv use of ice  Avoid triggering activities Trial of  voltaren gel F/u if not imp

## 2021-11-22 NOTE — Assessment & Plan Note (Signed)
Pt has found multiple ticks on her /is outdoors a lot (both deer and dog ticks) Last actual bite was on R abd with no bullseye or rash   Some c/o fatigue and muscle/joint aches No fever or rash   Testing for rmsf and lyme done today  Also cbc Pend results  Reassuring exam

## 2021-11-23 ENCOUNTER — Telehealth: Payer: Self-pay

## 2021-11-23 NOTE — Telephone Encounter (Signed)
-----   Message from Abner Greenspan, MD sent at 11/22/2021  8:31 PM EDT ----- Cbc is normal  Pending tick labs

## 2021-11-23 NOTE — Telephone Encounter (Signed)
Patient is returning phone call.  °

## 2021-11-23 NOTE — Telephone Encounter (Signed)
I left a message for the patient to return my call.

## 2021-11-26 LAB — ROCKY MTN SPOTTED FVR ABS PNL(IGG+IGM)
RMSF IgG: NOT DETECTED
RMSF IgM: NOT DETECTED

## 2021-11-26 LAB — B. BURGDORFI ANTIBODIES: B burgdorferi Ab IgG+IgM: <0.9 index

## 2021-11-29 NOTE — Telephone Encounter (Signed)
Spoke with patient regarding lab results. 

## 2022-01-31 ENCOUNTER — Other Ambulatory Visit: Payer: Self-pay | Admitting: Family Medicine

## 2022-02-27 ENCOUNTER — Telehealth: Payer: Self-pay | Admitting: Family Medicine

## 2022-02-27 DIAGNOSIS — Z0000 Encounter for general adult medical examination without abnormal findings: Secondary | ICD-10-CM

## 2022-02-27 NOTE — Telephone Encounter (Signed)
-----   Message from Ellamae Sia sent at 02/15/2022 11:51 AM EDT ----- Regarding: Lab orders for Monday 9.11.23 Patient is scheduled for CPX labs, please order future labs, Thanks , Karna Christmas

## 2022-02-28 ENCOUNTER — Other Ambulatory Visit (INDEPENDENT_AMBULATORY_CARE_PROVIDER_SITE_OTHER): Payer: No Typology Code available for payment source

## 2022-02-28 DIAGNOSIS — Z0000 Encounter for general adult medical examination without abnormal findings: Secondary | ICD-10-CM | POA: Diagnosis not present

## 2022-02-28 LAB — COMPREHENSIVE METABOLIC PANEL
ALT: 16 U/L (ref 0–35)
AST: 19 U/L (ref 0–37)
Albumin: 4 g/dL (ref 3.5–5.2)
Alkaline Phosphatase: 46 U/L (ref 39–117)
BUN: 12 mg/dL (ref 6–23)
CO2: 26 mEq/L (ref 19–32)
Calcium: 8.8 mg/dL (ref 8.4–10.5)
Chloride: 105 mEq/L (ref 96–112)
Creatinine, Ser: 0.9 mg/dL (ref 0.40–1.20)
GFR: 75.53 mL/min (ref >60.00)
Glucose, Bld: 93 mg/dL (ref 70–99)
Potassium: 4 mEq/L (ref 3.5–5.1)
Sodium: 138 mEq/L (ref 135–145)
Total Bilirubin: 0.4 mg/dL (ref 0.2–1.2)
Total Protein: 6.6 g/dL (ref 6.0–8.3)

## 2022-02-28 LAB — CBC WITH DIFFERENTIAL/PLATELET
Basophils Absolute: 0.1 10*3/uL (ref 0.0–0.1)
Basophils Relative: 1.9 % (ref 0.0–3.0)
Eosinophils Absolute: 0.5 10*3/uL (ref 0.0–0.7)
Eosinophils Relative: 11.6 % — ABNORMAL HIGH (ref 0.0–5.0)
HCT: 39.2 % (ref 36.0–46.0)
Hemoglobin: 13 g/dL (ref 12.0–15.0)
Lymphocytes Relative: 31.8 % (ref 12.0–46.0)
Lymphs Abs: 1.3 10*3/uL (ref 0.7–4.0)
MCHC: 33.3 g/dL (ref 30.0–36.0)
MCV: 93.2 fl (ref 78.0–100.0)
Monocytes Absolute: 0.5 10*3/uL (ref 0.1–1.0)
Monocytes Relative: 11.2 % (ref 3.0–12.0)
Neutro Abs: 1.8 10*3/uL (ref 1.4–7.7)
Neutrophils Relative %: 43.5 % (ref 43.0–77.0)
Platelets: 247 10*3/uL (ref 150.0–400.0)
RBC: 4.21 Mil/uL (ref 3.87–5.11)
RDW: 12.7 % (ref 11.5–15.5)
WBC: 4.1 10*3/uL (ref 4.0–10.5)

## 2022-02-28 LAB — LIPID PANEL
Cholesterol: 140 mg/dL (ref 0–200)
HDL: 48.8 mg/dL (ref >39.00)
LDL Cholesterol: 75 mg/dL (ref 0–99)
NonHDL: 91.69
Total CHOL/HDL Ratio: 3
Triglycerides: 81 mg/dL (ref 0.0–149.0)
VLDL: 16.2 mg/dL (ref 0.0–40.0)

## 2022-02-28 LAB — TSH: TSH: 1.84 u[IU]/mL (ref 0.35–5.50)

## 2022-03-07 ENCOUNTER — Encounter: Payer: No Typology Code available for payment source | Admitting: Family Medicine

## 2022-03-15 ENCOUNTER — Encounter: Payer: Self-pay | Admitting: Family Medicine

## 2022-03-15 ENCOUNTER — Ambulatory Visit (INDEPENDENT_AMBULATORY_CARE_PROVIDER_SITE_OTHER): Payer: No Typology Code available for payment source | Admitting: Family Medicine

## 2022-03-15 VITALS — BP 112/62 | HR 62 | Temp 97.9°F | Ht 69.5 in | Wt 216.6 lb

## 2022-03-15 DIAGNOSIS — Z23 Encounter for immunization: Secondary | ICD-10-CM | POA: Diagnosis not present

## 2022-03-15 DIAGNOSIS — Z1231 Encounter for screening mammogram for malignant neoplasm of breast: Secondary | ICD-10-CM | POA: Diagnosis not present

## 2022-03-15 DIAGNOSIS — Z0000 Encounter for general adult medical examination without abnormal findings: Secondary | ICD-10-CM | POA: Diagnosis not present

## 2022-03-15 DIAGNOSIS — Z1211 Encounter for screening for malignant neoplasm of colon: Secondary | ICD-10-CM | POA: Diagnosis not present

## 2022-03-15 NOTE — Assessment & Plan Note (Signed)
Reviewed health habits including diet and exercise and skin cancer prevention Reviewed appropriate screening tests for age  Also reviewed health mt list, fam hx and immunization status , as well as social and family history   See HPI Labs reviewed  Flu shot given Mammogram ordered-pt to schedule  Colonoscopy referral done Pap utd 02/2021  Encouraged good exercise program

## 2022-03-15 NOTE — Progress Notes (Signed)
Subjective:    Patient ID: Angela Gates, female    DOB: 1976/02/24, 46 y.o.   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   Feeling a lot better recently   Was very stressed out at work  Is back into a new routine - with exercise  2-3 times per week  Has a water rower and kayaks outdoors  Dropped alcohol on the week days     Immunization History  Administered Date(s) Administered   Influenza,inj,Quad PF,6+ Mos 05/28/2013, 06/02/2014, 02/20/2019, 02/26/2020, 03/01/2021   PFIZER(Purple Top)SARS-COV-2 Vaccination 09/18/2019, 10/16/2019   Td 06/20/1994, 03/04/2008, 01/13/2009, 02/26/2020   Health Maintenance Due  Topic Date Due   COLONOSCOPY (Pts 45-70yr Insurance coverage will need to be confirmed)  Never done   COVID-19 Vaccine (3 - Pfizer series) 12/11/2019   INFLUENZA VACCINE  01/18/2022    Colon cancer screening : she was going to schedule colonoscopy-got busy  Still wants to do it     Flu shot : today   Mammogram 02/2021 Self breast exam: no lumps   Declines std screening   Pap 02/2021 nl with neg HPV  On OC 1/20   She skipped period for 2 months  It came back  Suspects perimenopause  No hot flashes    BP Readings from Last 3 Encounters:  03/15/22 112/62  11/22/21 116/70  03/01/21 110/60     Pulse Readings from Last 3 Encounters:  03/15/22 62  11/22/21 66  03/01/21 64   Labs  Cholesterol Lab Results  Component Value Date   CHOL 140 02/28/2022   CHOL 144 03/23/2021   CHOL 130 02/23/2021   Lab Results  Component Value Date   HDL 48.80 02/28/2022   HDL 49.50  03/23/2021   HDL 44.00 02/23/2021   Lab Results  Component Value Date   LDLCALC 75 02/28/2022   LDLCALC 81 03/23/2021   LDLCALC 73 02/23/2021   Lab Results  Component Value Date   TRIG 81.0 02/28/2022   TRIG 68.0 03/23/2021   TRIG 63.0 02/23/2021   Lab Results  Component Value Date   CHOLHDL 3 02/28/2022   CHOLHDL 3 03/23/2021   CHOLHDL 3 02/23/2021   No results found for: "LDLDIRECT" Eating super healthy     Other labs Results for orders placed or performed in visit on 02/28/22  CBC with Differential/Platelet  Result Value Ref Range   WBC 4.1 4.0 - 10.5 K/uL   RBC 4.21 3.87 - 5.11 Mil/uL   Hemoglobin 13.0 12.0 - 15.0 g/dL   HCT 39.2 36.0 - 46.0 %   MCV 93.2 78.0 - 100.0 fl   MCHC 33.3 30.0 - 36.0 g/dL   RDW 12.7 11.5 - 15.5 %   Platelets 247.0 150.0 - 400.0 K/uL   Neutrophils Relative % 43.5 43.0 - 77.0 %   Lymphocytes Relative 31.8 12.0 - 46.0 %   Monocytes Relative 11.2 3.0 - 12.0 %   Eosinophils Relative 11.6 (H) 0.0 - 5.0 %   Basophils Relative 1.9 0.0 - 3.0 %  Neutro Abs 1.8 1.4 - 7.7 K/uL   Lymphs Abs 1.3 0.7 - 4.0 K/uL   Monocytes Absolute 0.5 0.1 - 1.0 K/uL   Eosinophils Absolute 0.5 0.0 - 0.7 K/uL   Basophils Absolute 0.1 0.0 - 0.1 K/uL  Comprehensive metabolic panel  Result Value Ref Range   Sodium 138 135 - 145 mEq/L   Potassium 4.0 3.5 - 5.1 mEq/L   Chloride 105 96 - 112 mEq/L   CO2 26 19 - 32 mEq/L   Glucose, Bld 93 70 - 99 mg/dL   BUN 12 6 - 23 mg/dL   Creatinine, Ser 0.90 0.40 - 1.20 mg/dL   Total Bilirubin 0.4 0.2 - 1.2 mg/dL   Alkaline Phosphatase 46 39 - 117 U/L   AST 19 0 - 37 U/L   ALT 16 0 - 35 U/L   Total Protein 6.6 6.0 - 8.3 g/dL   Albumin 4.0 3.5 - 5.2 g/dL   GFR 75.53 >60.00 mL/min   Calcium 8.8 8.4 - 10.5 mg/dL  Lipid panel  Result Value Ref Range   Cholesterol 140 0 - 200 mg/dL   Triglycerides 81.0 0.0 - 149.0 mg/dL   HDL 48.80 >39.00 mg/dL   VLDL 16.2 0.0 - 40.0 mg/dL   LDL Cholesterol 75 0 - 99 mg/dL   Total  CHOL/HDL Ratio 3    NonHDL 91.69   TSH  Result Value Ref Range   TSH 1.84 0.35 - 5.50 uIU/mL    Baseline eosinophil elevation  Has nasal allergies year around  Uses flonase in season moreso   Patient Active Problem List   Diagnosis Date Noted   Colon cancer screening 03/01/2021   Encounter for hepatitis C screening test for low risk patient 02/20/2020   Encounter for screening mammogram for breast cancer 06/02/2014   Routine general medical examination at a health care facility 05/28/2013   Encounter for routine gynecological examination 05/28/2013   ANXIETY 12/02/2008   ALLERGIC RHINITIS 11/22/2007   TOBACCO USE, QUIT 11/21/2007   Past Medical History:  Diagnosis Date   History of chicken pox    History of UTI    Past Surgical History:  Procedure Laterality Date   CHOLECYSTECTOMY  1995   RESECTION DISTAL CLAVICAL  2009   Social History   Tobacco Use   Smoking status: Former   Smokeless tobacco: Never   Tobacco comments:    smoked in college  Vaping Use   Vaping Use: Never used  Substance Use Topics   Alcohol use: Yes    Alcohol/week: 0.0 standard drinks of alcohol    Comment: occ-few beers a week   Drug use: No   Family History  Problem Relation Age of Onset   Colon cancer Paternal Aunt    Skin cancer Paternal Grandfather    Skin cancer Paternal Grandmother    High blood pressure Paternal Grandmother    Skin cancer Father    High blood pressure Father    Prostate cancer Maternal Grandfather    Prostate cancer Maternal Uncle    Depression Sister    No Known Allergies Current Outpatient Medications on File Prior to Visit  Medication Sig Dispense Refill   VIENVA 0.1-20 MG-MCG tablet TAKE 1 TABLET BY MOUTH EVERY DAY 84 tablet 3   No current facility-administered medications on file prior to visit.     Review of Systems  Constitutional:  Negative for activity change, appetite change, fatigue, fever and unexpected weight change.  HENT:  Negative for  congestion, ear pain,  rhinorrhea, sinus pressure and sore throat.   Eyes:  Negative for pain, redness and visual disturbance.  Respiratory:  Negative for cough, shortness of breath and wheezing.   Cardiovascular:  Negative for chest pain and palpitations.  Gastrointestinal:  Negative for abdominal pain, blood in stool, constipation and diarrhea.  Endocrine: Negative for polydipsia and polyuria.  Genitourinary:  Negative for dysuria, frequency and urgency.  Musculoskeletal:  Negative for arthralgias, back pain and myalgias.  Skin:  Negative for pallor and rash.  Allergic/Immunologic: Negative for environmental allergies.  Neurological:  Negative for dizziness, syncope and headaches.  Hematological:  Negative for adenopathy. Does not bruise/bleed easily.  Psychiatric/Behavioral:  Negative for decreased concentration and dysphoric mood. The patient is not nervous/anxious.        Objective:   Physical Exam Constitutional:      General: She is not in acute distress.    Appearance: Normal appearance. She is well-developed. She is obese. She is not ill-appearing or diaphoretic.  HENT:     Head: Normocephalic and atraumatic.     Right Ear: Tympanic membrane, ear canal and external ear normal.     Left Ear: Tympanic membrane, ear canal and external ear normal.     Nose: Nose normal. No congestion.     Mouth/Throat:     Mouth: Mucous membranes are moist.     Pharynx: Oropharynx is clear. No posterior oropharyngeal erythema.  Eyes:     General: No scleral icterus.    Extraocular Movements: Extraocular movements intact.     Conjunctiva/sclera: Conjunctivae normal.     Pupils: Pupils are equal, round, and reactive to light.  Neck:     Thyroid: No thyromegaly.     Vascular: No carotid bruit or JVD.  Cardiovascular:     Rate and Rhythm: Normal rate and regular rhythm.     Pulses: Normal pulses.     Heart sounds: Normal heart sounds.     No gallop.  Pulmonary:     Effort: Pulmonary effort  is normal. No respiratory distress.     Breath sounds: Normal breath sounds. No wheezing.     Comments: Good air exch Chest:     Chest wall: No tenderness.  Abdominal:     General: Bowel sounds are normal. There is no distension or abdominal bruit.     Palpations: Abdomen is soft. There is no mass.     Tenderness: There is no abdominal tenderness.     Hernia: No hernia is present.  Genitourinary:    Comments: Breast exam: No mass, nodules, thickening, tenderness, bulging, retraction, inflamation, nipple discharge or skin changes noted.  No axillary or clavicular LA.     Musculoskeletal:        General: No tenderness. Normal range of motion.     Cervical back: Normal range of motion and neck supple. No rigidity. No muscular tenderness.     Right lower leg: No edema.     Left lower leg: No edema.     Comments: No kyphosis   Lymphadenopathy:     Cervical: No cervical adenopathy.  Skin:    General: Skin is warm and dry.     Coloration: Skin is not pale.     Findings: No erythema or rash.     Comments: Olive complexion  Solar lentigines diffusely on sun exp areas  Neurological:     Mental Status: She is alert. Mental status is at baseline.     Cranial Nerves: No cranial nerve deficit.  Motor: No abnormal muscle tone.     Coordination: Coordination normal.     Gait: Gait normal.     Deep Tendon Reflexes: Reflexes are normal and symmetric. Reflexes normal.  Psychiatric:        Mood and Affect: Mood normal.        Cognition and Memory: Cognition and memory normal.           Assessment & Plan:   Problem List Items Addressed This Visit       Other   Colon cancer screening    Referral done for screening colonoscopy       Relevant Orders   Ambulatory referral to Gastroenterology   Encounter for screening mammogram for breast cancer    Mammogram order done Pt will call to schedule it at the breast center        Relevant Orders   MM 3D SCREEN BREAST BILATERAL    Routine general medical examination at a health care facility - Primary    Reviewed health habits including diet and exercise and skin cancer prevention Reviewed appropriate screening tests for age  Also reviewed health mt list, fam hx and immunization status , as well as social and family history   See HPI Labs reviewed  Flu shot given Mammogram ordered-pt to schedule  Colonoscopy referral done Pap utd 02/2021  Encouraged good exercise program

## 2022-03-15 NOTE — Assessment & Plan Note (Signed)
Mammogram order done Pt will call to schedule it at the breast center

## 2022-03-15 NOTE — Assessment & Plan Note (Signed)
Referral done for screening colonoscopy

## 2022-03-15 NOTE — Patient Instructions (Addendum)
Call to schedule colonoscopy in January   [facility]  [phone]  Keep up the healthy diet and exercise Flu shot today   Call and schedule your mammogram  Please call the location of your choice from the menu below to schedule your Mammogram and/or Bone Density appointment.    [facility]                      Phone:  [phone], [place] [address]                                                             Services: Traditional and 3D Mammogram, [place] [facility]                 Phone: [phone] [address], [place] [address]    Service: Bone Density ONLY   *this site does NOT perform mammograms  [place]                        Phone:  [phone] [address], [place] [address]                                            Services:  3D Mammogram and [place] at [facility]   Phone:  [phone]   [place], [place] [address]                                            Services: 3D Mammogram and [place]  [place] at [facility]  Phone:  [phone]   [address]  [place], [place] [address]                                              Services:  3D Mammogram and Bone Density  

## 2022-04-07 ENCOUNTER — Encounter: Payer: Self-pay | Admitting: Gastroenterology

## 2022-05-04 ENCOUNTER — Ambulatory Visit (AMBULATORY_SURGERY_CENTER): Payer: Self-pay

## 2022-05-04 VITALS — Ht 70.0 in | Wt 215.0 lb

## 2022-05-04 DIAGNOSIS — Z83719 Family history of colon polyps, unspecified: Secondary | ICD-10-CM

## 2022-05-04 DIAGNOSIS — Z1211 Encounter for screening for malignant neoplasm of colon: Secondary | ICD-10-CM

## 2022-05-04 DIAGNOSIS — Z800 Family history of malignant neoplasm of digestive organs: Secondary | ICD-10-CM

## 2022-05-04 MED ORDER — NA SULFATE-K SULFATE-MG SULF 17.5-3.13-1.6 GM/177ML PO SOLN: 1.0000 | Freq: Once | ORAL | 0 refills | Status: AC

## 2022-05-04 NOTE — Progress Notes (Signed)

## 2022-05-24 ENCOUNTER — Encounter: Payer: No Typology Code available for payment source | Admitting: Gastroenterology

## 2022-05-31 ENCOUNTER — Encounter: Payer: Self-pay | Admitting: Gastroenterology

## 2022-06-07 ENCOUNTER — Encounter: Payer: Self-pay | Admitting: Gastroenterology

## 2022-06-07 ENCOUNTER — Ambulatory Visit (AMBULATORY_SURGERY_CENTER): Payer: No Typology Code available for payment source | Admitting: Gastroenterology

## 2022-06-07 VITALS — BP 99/54 | HR 63 | Temp 96.6°F | Resp 12 | Ht 69.5 in | Wt 215.0 lb

## 2022-06-07 DIAGNOSIS — D124 Benign neoplasm of descending colon: Secondary | ICD-10-CM | POA: Diagnosis not present

## 2022-06-07 DIAGNOSIS — Z1211 Encounter for screening for malignant neoplasm of colon: Secondary | ICD-10-CM

## 2022-06-07 DIAGNOSIS — K635 Polyp of colon: Secondary | ICD-10-CM | POA: Diagnosis not present

## 2022-06-07 MED ORDER — SODIUM CHLORIDE 0.9 % IV SOLN: 500.0000 mL | Freq: Once | INTRAVENOUS | Status: DC

## 2022-06-07 NOTE — Progress Notes (Signed)
History and Physical:  This patient presents for endoscopic testing for: Encounter Diagnosis  Name Primary?   Special screening for malignant neoplasms, colon Yes    Average risk - first screening exam. Patient denies chronic abdominal pain, rectal bleeding, constipation or diarrhea.   Patient is otherwise without complaints or active issues today.   Past Medical History: Past Medical History:  Diagnosis Date   Allergy    Arthritis    History of chicken pox    History of UTI      Past Surgical History: Past Surgical History:  Procedure Laterality Date   CHOLECYSTECTOMY  1995   RESECTION DISTAL CLAVICAL  2009    Allergies: No Known Allergies  Outpatient Meds: Current Outpatient Medications  Medication Sig Dispense Refill   VIENVA 0.1-20 MG-MCG tablet TAKE 1 TABLET BY MOUTH EVERY DAY 84 tablet 3   Current Facility-Administered Medications  Medication Dose Route Frequency Provider Last Rate Last Admin   0.9 %  sodium chloride infusion  500 mL Intravenous Once Doran Stabler, MD          ___________________________________________________________________ Objective   Exam:  BP 116/73   Pulse 70   Temp (!) 96.6 F (35.9 C)   Resp 20   Ht 5' 9.5" (1.765 m)   Wt 215 lb (97.5 kg)   SpO2 98%   BMI 31.29 kg/m   CV: regular , S1/S2 Resp: clear to auscultation bilaterally, normal RR and effort noted GI: soft, no tenderness, with active bowel sounds.   Assessment: Encounter Diagnosis  Name Primary?   Special screening for malignant neoplasms, colon Yes     Plan: Colonoscopy  The benefits and risks of the planned procedure were described in detail with the patient or (when appropriate) their health care proxy.  Risks were outlined as including, but not limited to, bleeding, infection, perforation, adverse medication reaction leading to cardiac or pulmonary decompensation, pancreatitis (if ERCP).  The limitation of incomplete mucosal visualization was  also discussed.  No guarantees or warranties were given.    The patient is appropriate for an endoscopic procedure in the ambulatory setting.   - Wilfrid Lund, MD

## 2022-06-07 NOTE — Progress Notes (Signed)
Called to room to assist during endoscopic procedure.  Patient ID and intended procedure confirmed with present staff. Received instructions for my participation in the procedure from the performing physician.  

## 2022-06-07 NOTE — Progress Notes (Signed)
Pt resting comfortably. VSS. Airway intact. SBAR complete to RN. All questions answered.   

## 2022-06-07 NOTE — Op Note (Signed)
[place] Patient Name: Angela Gates Procedure Date: 06/07/2022 7:58 AM MRN: [id number] Endoscopist: [place]. Loletha Carrow , MD, [id number] Age: 46 Referring MD:  Date of Birth: 1976-03-09 Gender: Female Account #: [id number] Procedure:                Colonoscopy Indications:              Screening for colorectal malignant neoplasm, This                            is the patient's first colonoscopy Medicines:                Monitored Anesthesia Care Procedure:                Pre-Anesthesia Assessment:                           - Prior to the procedure, a History and Physical                            was performed, and patient medications and                            allergies were reviewed. The patient's tolerance of                            previous anesthesia was also reviewed. The risks                            and benefits of the procedure and the sedation                            options and risks were discussed with the patient.                            All questions were answered, and informed consent                            was obtained. Prior Anticoagulants: The patient has                            taken no anticoagulant or antiplatelet agents. ASA                            Grade Assessment: II - A patient with mild systemic                            disease. After reviewing the risks and benefits,                            the patient was deemed in satisfactory condition to                            undergo the procedure.  After obtaining informed consent, the colonoscope                            was passed under direct vision. Throughout the                            procedure, the patient's blood pressure, pulse, and                            oxygen saturations were monitored continuously. The                            CF [id number] #[id number] was introduced through the anus                            and advanced to the  the cecum, identified by                            appendiceal orifice and ileocecal valve. The                            colonoscopy was performed with difficulty due to a                            redundant colon and significant looping. Successful                            completion of the procedure was aided by using                            manual pressure and straightening and shortening                            the scope to obtain bowel loop reduction. The                            patient tolerated the procedure well. The quality                            of the bowel preparation was excellent. The                            ileocecal valve, appendiceal orifice, and rectum                            were photographed. The bowel preparation used was                            SUPREP via split dose instruction. Scope In: 8:07:14 AM Scope Out: 8:30:01 AM Scope Withdrawal Time: 0 hours 13 minutes 39 seconds  Total Procedure Duration: 0 hours 22 minutes 47 seconds  Findings:                 The perianal and digital rectal examinations  were                            normal.                           The colon (entire examined portion) was redundant.                           A diminutive polyp was found in the descending                            colon. The polyp was flat. The polyp was removed                            with a cold snare. Resection and retrieval were                            complete.                           Repeat examination of right colon under NBI                            performed.                           The exam was otherwise without abnormality on                            direct and retroflexion views. Complications:            No immediate complications. Estimated Blood Loss:     Estimated blood loss was minimal. Impression:               - Redundant colon.                           - One diminutive polyp in the descending colon,                             removed with a cold snare. Resected and retrieved.                           - The examination was otherwise normal on direct                            and retroflexion views. Recommendation:           - Patient has a contact number available for                            emergencies. The signs and symptoms of potential                            delayed complications were discussed with the  patient. Return to normal activities tomorrow.                            Written discharge instructions were provided to the                            patient.                           - Resume previous diet.                           - Continue present medications.                           - Await pathology results.                           - Repeat colonoscopy is recommended for                            surveillance. The colonoscopy date will be                            determined after pathology results from today's                            exam become available for review. Atsushi Yom L. Loletha Carrow, MD 06/07/2022 8:35:41 AM This report has been signed electronically.

## 2022-06-07 NOTE — Patient Instructions (Signed)
Please read handouts provided. Continue present medications. Await pathology results.   YOU HAD AN ENDOSCOPIC PROCEDURE TODAY AT THE [facility]:   Refer to the procedure report that was given to you for any specific questions about what was found during the examination.  If the procedure report does not answer your questions, please call your gastroenterologist to clarify.  If you requested that your care partner not be given the details of your procedure findings, then the procedure report has been included in a sealed envelope for you to review at your convenience later.  YOU SHOULD EXPECT: Some feelings of bloating in the abdomen. Passage of more gas than usual.  Walking can help get rid of the air that was put into your GI tract during the procedure and reduce the bloating. If you had a lower endoscopy (such as a colonoscopy or flexible sigmoidoscopy) you may notice spotting of blood in your stool or on the toilet paper. If you underwent a bowel prep for your procedure, you may not have a normal bowel movement for a few days.  Please Note:  You might notice some irritation and congestion in your nose or some drainage.  This is from the oxygen used during your procedure.  There is no need for concern and it should clear up in a day or so.  SYMPTOMS TO REPORT IMMEDIATELY:  Following lower endoscopy (colonoscopy or flexible sigmoidoscopy):  Excessive amounts of blood in the stool  Significant tenderness or worsening of abdominal pains  Swelling of the abdomen that is new, acute  Fever of 100F or higher  For urgent or emergent issues, a gastroenterologist can be reached at any hour by calling [phone]. Do not use MyChart messaging for urgent concerns.    DIET:  We do recommend a small meal at first, but then you may proceed to your regular diet.  Drink plenty of fluids but you should avoid alcoholic beverages for 24 hours.  ACTIVITY:  You should plan to take it easy for  the rest of today and you should NOT DRIVE or use heavy machinery until tomorrow (because of the sedation medicines used during the test).    FOLLOW UP: Our staff will call the number listed on your records the next business day following your procedure.  We will call around 7:15- 8:00 am to check on you and address any questions or concerns that you may have regarding the information given to you following your procedure. If we do not reach you, we will leave a message.     If any biopsies were taken you will be contacted by phone or by letter within the next 1-3 weeks.  Please call us at [phone] if you have not heard about the biopsies in 3 weeks.    SIGNATURES/CONFIDENTIALITY: You and/or your care partner have signed paperwork which will be entered into your electronic medical record.  These signatures attest to the fact that that the information above on your After Visit Summary has been reviewed and is understood.  Full responsibility of the confidentiality of this discharge information lies with you and/or your care-partner. 

## 2022-06-08 ENCOUNTER — Telehealth: Payer: Self-pay | Admitting: *Deleted

## 2022-06-08 NOTE — Telephone Encounter (Signed)
  Follow up Call-     06/07/2022    7:12 AM  Call back number  Post procedure Call Back phone  # [phone]  Permission to leave phone message Yes     Patient questions:  Do you have a fever, pain , or abdominal swelling? No. Pain Score  0 *  Have you tolerated food without any problems? Yes.    Have you been able to return to your normal activities? Yes.    Do you have any questions about your discharge instructions: Diet   No. Medications  No. Follow up visit  No.  Do you have questions or concerns about your Care? No.  Actions: * If pain score is 4 or above: No action needed, pain <4.

## 2022-06-10 ENCOUNTER — Encounter: Payer: Self-pay | Admitting: Gastroenterology

## 2022-06-13 IMAGING — MG MM DIGITAL SCREENING BILAT W/ TOMO AND CAD
6 of 12 series · 6 of 36 positions shown · non-contrast
Comparison: Previous exam(s).

CLINICAL DATA: Screening.

EXAM:
DIGITAL SCREENING BILATERAL MAMMOGRAM WITH TOMOSYNTHESIS AND CAD
TECHNIQUE: Bilateral screening digital craniocaudal and mediolateral oblique
mammograms were obtained. Bilateral screening digital breast
tomosynthesis was performed. The images were evaluated with
computer-aided detection.

[R CC synth-2D]
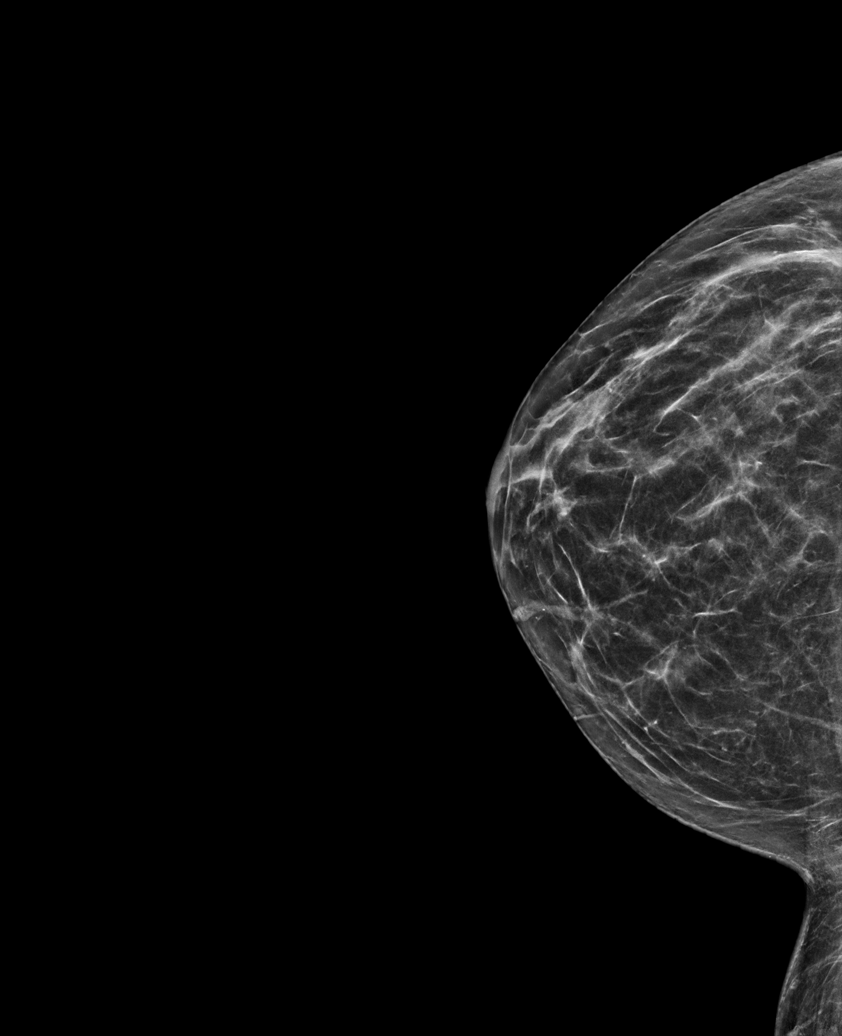

[L MLO synth-2D (1 of 2)]
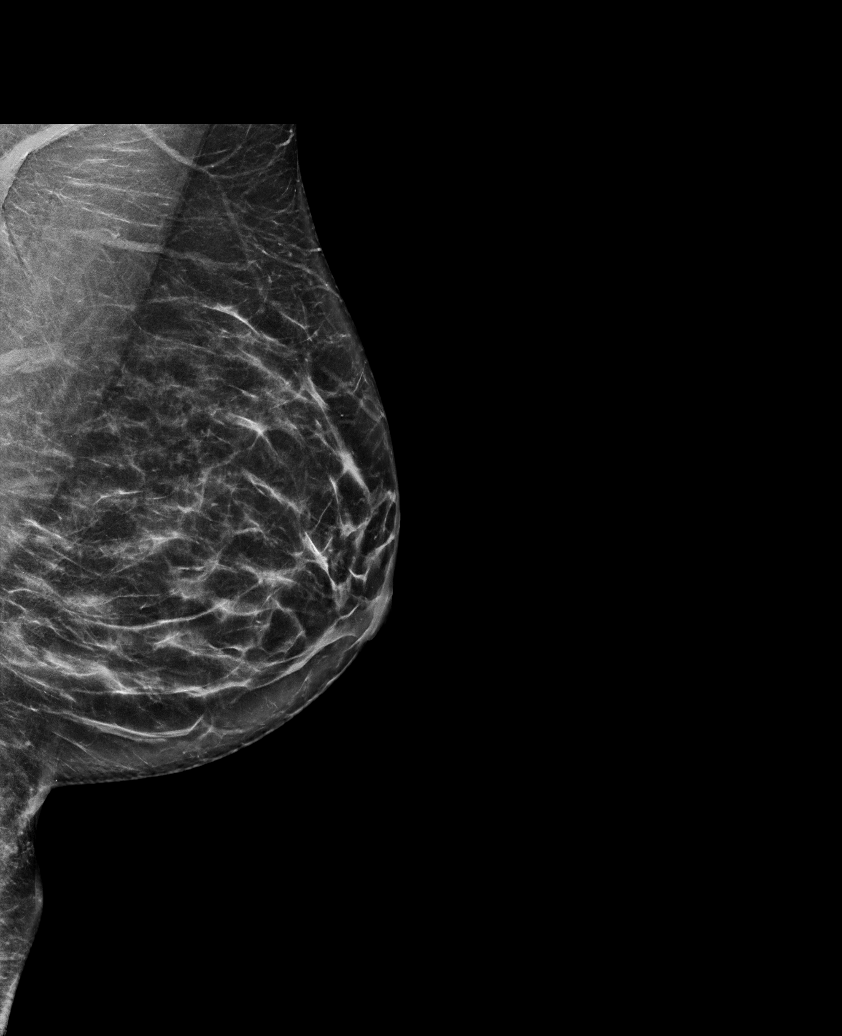

[L MLO synth-2D (2 of 2)]
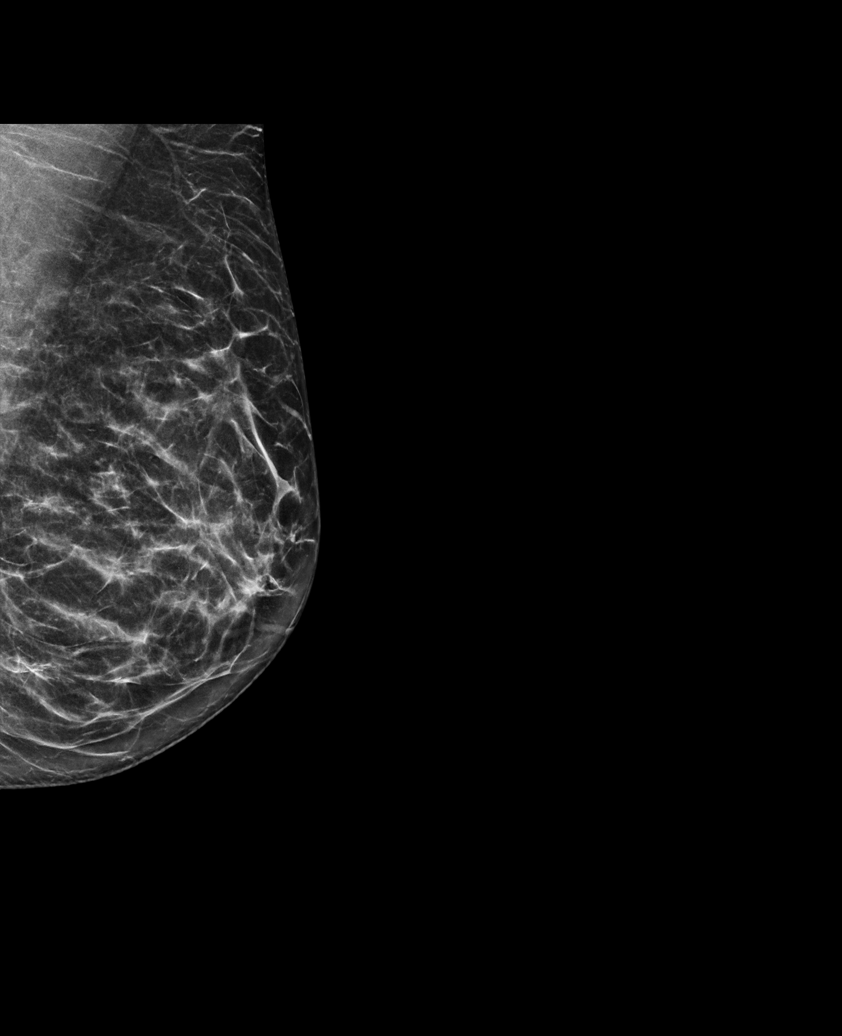

[R MLO synth-2D]
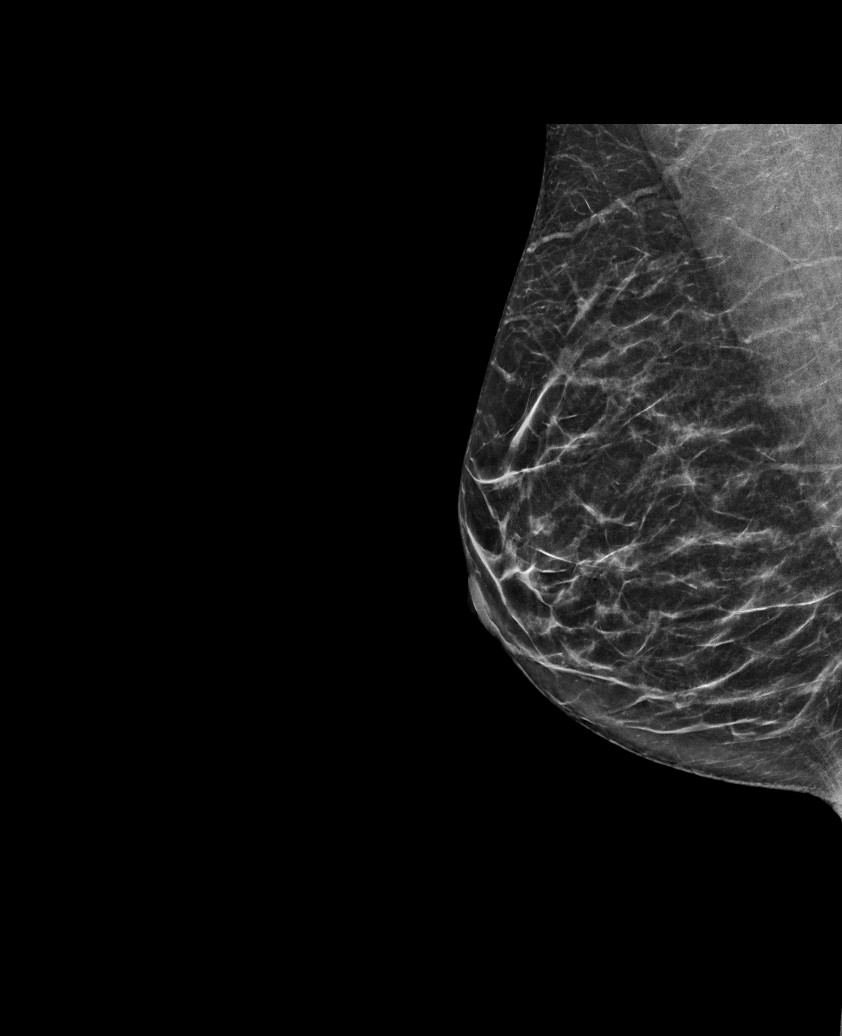

[L XCCL synth-2D]
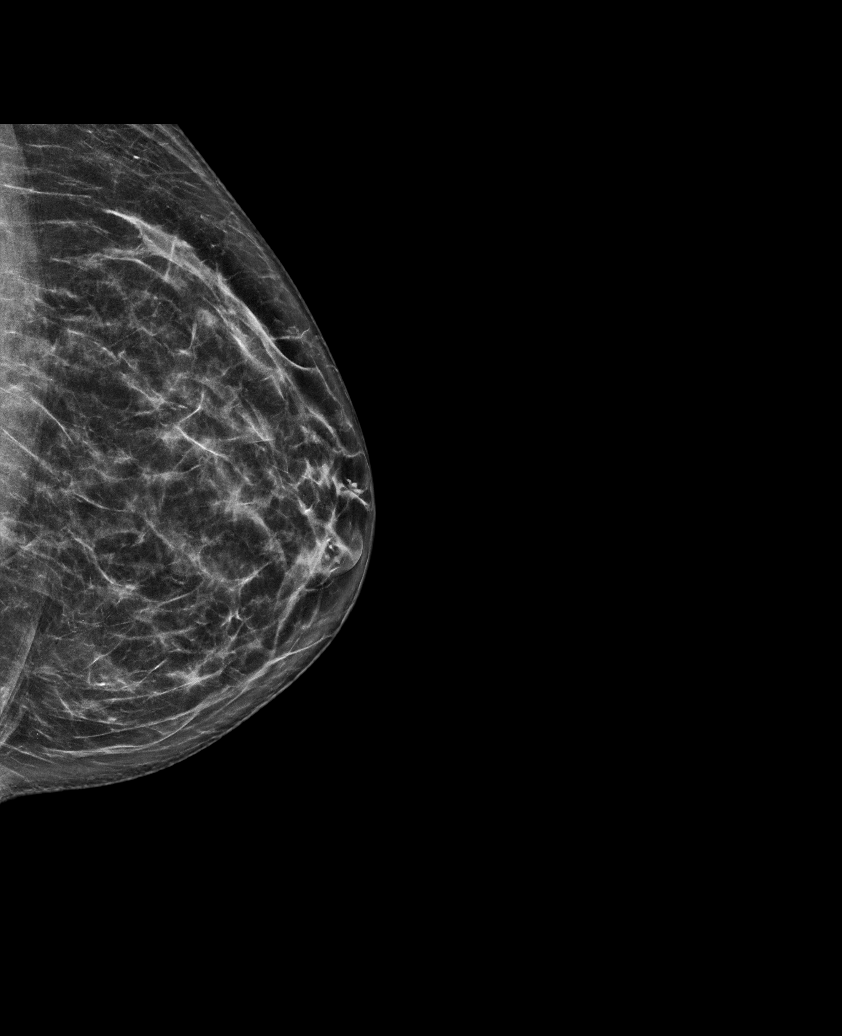

[L CC synth-2D]
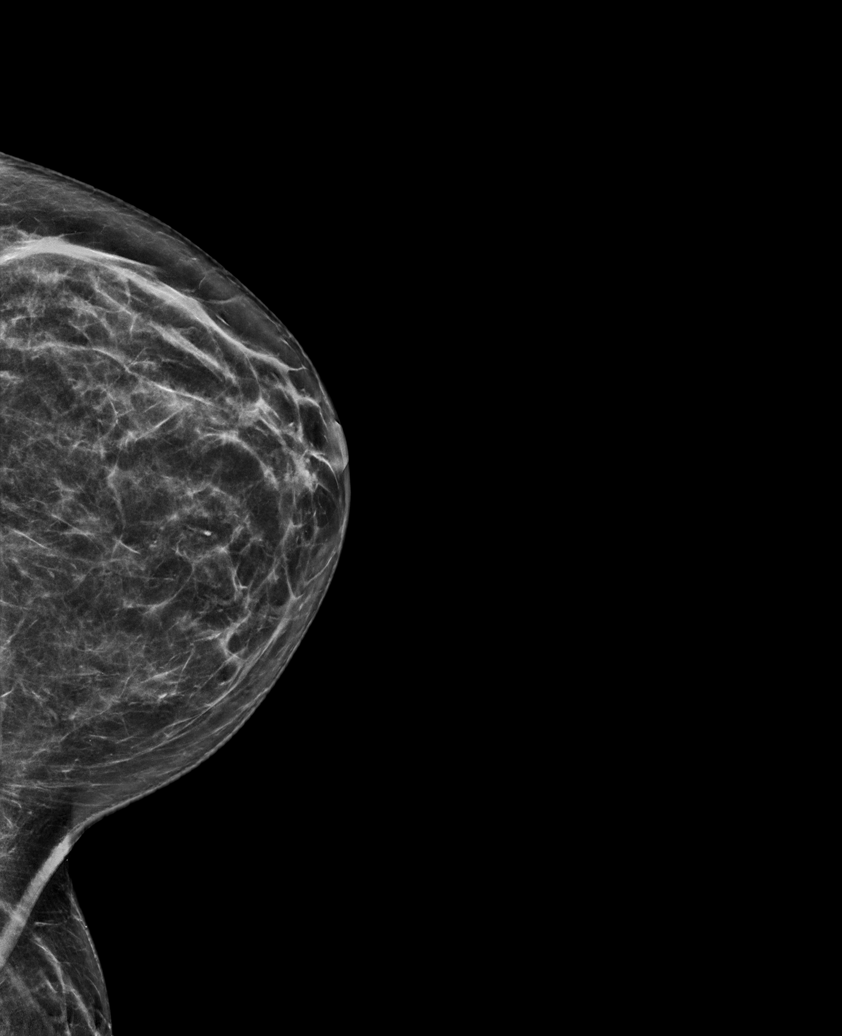

[6 of 36 positions shown; findings below may reference images not displayed]

ACR Breast Density Category b: There are scattered areas of
fibroglandular density.
FINDINGS: There are no findings suspicious for malignancy.
IMPRESSION: No mammographic evidence of malignancy. A result letter of this
screening mammogram will be mailed directly to the patient.

RECOMMENDATION:
Screening mammogram in one year. (Code:51-O-LD2)

BI-RADS CATEGORY  1: Negative.

## 2022-07-08 ENCOUNTER — Ambulatory Visit
Admission: RE | Admit: 2022-07-08 | Discharge: 2022-07-08 | Disposition: A | Discharge status: Home / Self Care | Payer: No Typology Code available for payment source | Source: Ambulatory Visit | Attending: Family Medicine | Admitting: Family Medicine

## 2022-07-08 DIAGNOSIS — Z1231 Encounter for screening mammogram for malignant neoplasm of breast: Secondary | ICD-10-CM

## 2022-10-20 ENCOUNTER — Telehealth: Payer: Self-pay | Admitting: Family Medicine

## 2022-10-20 MED ORDER — LEVONORGESTREL-ETHINYL ESTRAD 0.1-20 MG-MCG PO TABS: 1.0000 | ORAL_TABLET | Freq: Every day | ORAL | 1 refills | Status: DC

## 2022-10-20 NOTE — Telephone Encounter (Signed)
Prescription Request  10/20/2022  LOV: 03/15/2022  What is the name of the medication or equipment? VIENVA 0.1-20 MG-MCG tablet   Have you contacted your pharmacy to request a refill? No   Which pharmacy would you like this sent to?  [id number] [place], [place] - [address] AT [facility] [address] & [facility] [address] [place] [address] Phone: [phone] Fax: [phone]    Patient notified that their request is being sent to the clinical staff for review and that they should receive a response within 2 business days.   Please advise at Mobile [phone] (mobile)

## 2023-03-13 ENCOUNTER — Telehealth: Payer: Self-pay | Admitting: Family Medicine

## 2023-03-13 DIAGNOSIS — Z0000 Encounter for general adult medical examination without abnormal findings: Secondary | ICD-10-CM

## 2023-03-13 NOTE — Telephone Encounter (Signed)
-----   Message from Vincenza Hews sent at 02/23/2023  1:59 PM EDT ----- Regarding: Labs on Tues. 03/14/23 Hello,  Patient is coming in for CPE labs on Tuesday 03/14/23. Can we get orders please.   Thanks

## 2023-03-14 ENCOUNTER — Other Ambulatory Visit: Payer: 59

## 2023-03-14 DIAGNOSIS — Z0000 Encounter for general adult medical examination without abnormal findings: Secondary | ICD-10-CM | POA: Diagnosis not present

## 2023-03-14 LAB — COMPREHENSIVE METABOLIC PANEL
ALT: 12 U/L (ref 0–35)
AST: 14 U/L (ref 0–37)
Albumin: 4.2 g/dL (ref 3.5–5.2)
Alkaline Phosphatase: 41 U/L (ref 39–117)
BUN: 12 mg/dL (ref 6–23)
CO2: 25 mEq/L (ref 19–32)
Calcium: 9 mg/dL (ref 8.4–10.5)
Chloride: 105 mEq/L (ref 96–112)
Creatinine, Ser: 0.87 mg/dL (ref 0.40–1.20)
GFR: 78.09 mL/min (ref >60.00)
Glucose, Bld: 96 mg/dL (ref 70–99)
Potassium: 4.1 mEq/L (ref 3.5–5.1)
Sodium: 138 mEq/L (ref 135–145)
Total Bilirubin: 0.5 mg/dL (ref 0.2–1.2)
Total Protein: 7 g/dL (ref 6.0–8.3)

## 2023-03-14 LAB — CBC WITH DIFFERENTIAL/PLATELET
Basophils Absolute: 0.1 10*3/uL (ref 0.0–0.1)
Basophils Relative: 1.7 % (ref 0.0–3.0)
Eosinophils Absolute: 0.3 10*3/uL (ref 0.0–0.7)
Eosinophils Relative: 6.7 % — ABNORMAL HIGH (ref 0.0–5.0)
HCT: 41.4 % (ref 36.0–46.0)
Hemoglobin: 13.6 g/dL (ref 12.0–15.0)
Lymphocytes Relative: 30.6 % (ref 12.0–46.0)
Lymphs Abs: 1.3 10*3/uL (ref 0.7–4.0)
MCHC: 32.9 g/dL (ref 30.0–36.0)
MCV: 94.9 fl (ref 78.0–100.0)
Monocytes Absolute: 0.5 10*3/uL (ref 0.1–1.0)
Monocytes Relative: 10.6 % (ref 3.0–12.0)
Neutro Abs: 2.2 10*3/uL (ref 1.4–7.7)
Neutrophils Relative %: 50.4 % (ref 43.0–77.0)
Platelets: 291 10*3/uL (ref 150.0–400.0)
RBC: 4.36 Mil/uL (ref 3.87–5.11)
RDW: 12.6 % (ref 11.5–15.5)
WBC: 4.4 10*3/uL (ref 4.0–10.5)

## 2023-03-14 LAB — LIPID PANEL
Cholesterol: 161 mg/dL (ref 0–200)
HDL: 61.6 mg/dL (ref >39.00)
LDL Cholesterol: 82 mg/dL (ref 0–99)
NonHDL: 99.24
Total CHOL/HDL Ratio: 3
Triglycerides: 85 mg/dL (ref 0.0–149.0)
VLDL: 17 mg/dL (ref 0.0–40.0)

## 2023-03-14 LAB — TSH: TSH: 2.24 u[IU]/mL (ref 0.35–5.50)

## 2023-03-21 ENCOUNTER — Ambulatory Visit (INDEPENDENT_AMBULATORY_CARE_PROVIDER_SITE_OTHER): Payer: 59 | Admitting: Family Medicine

## 2023-03-21 ENCOUNTER — Encounter: Payer: Self-pay | Admitting: Family Medicine

## 2023-03-21 VITALS — BP 104/62 | HR 73 | Temp 98.5°F | Ht 69.5 in | Wt 172.5 lb

## 2023-03-21 DIAGNOSIS — Z23 Encounter for immunization: Secondary | ICD-10-CM | POA: Diagnosis not present

## 2023-03-21 DIAGNOSIS — Z0000 Encounter for general adult medical examination without abnormal findings: Secondary | ICD-10-CM | POA: Diagnosis not present

## 2023-03-21 DIAGNOSIS — Z1211 Encounter for screening for malignant neoplasm of colon: Secondary | ICD-10-CM

## 2023-03-21 MED ORDER — LEVONORGESTREL-ETHINYL ESTRAD 0.1-20 MG-MCG PO TABS: 1.0000 | ORAL_TABLET | Freq: Every day | ORAL | 3 refills | Status: DC

## 2023-03-21 NOTE — Progress Notes (Signed)
Subjective:    Patient ID: Angela Gates, female    DOB: 1975/12/27, 47 y.o.   MRN: [id number]  HPI  Here for health maintenance exam and to review chronic medical problems   Wt Readings from Last 3 Encounters:  03/21/23 172 lb 8 oz (78.2 kg)  06/07/22 215 lb (97.5 kg)  05/04/22 215 lb (97.5 kg)   25.11 kg/m  Vitals:   03/21/23 0801  BP: 104/62  Pulse: 73  Temp: 98.5 F (36.9 C)  SpO2: 96%    Immunization History  Administered Date(s) Administered   Influenza,inj,Quad PF,6+ Mos 05/28/2013, 06/02/2014, 02/20/2019, 02/26/2020, 03/01/2021, 03/15/2022   PFIZER(Purple Top)SARS-COV-2 Vaccination 09/18/2019, 10/16/2019   Td 06/20/1994, 03/04/2008, 01/13/2009, 02/26/2020    Health Maintenance Due  Topic Date Due   INFLUENZA VACCINE  01/19/2023   Doing well overall  Stress 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 Back to exercise routine (rows)  Eating better Less alcohol   Lean protein and veggies  Some intermittent fasting   Flu shot - today   Mammogram 06/2022 Self breast exam- no lumps   Gyn health Pap 02/2021 normal with neg HPV No new partners   On oc 1/20  Sometimes periods are irregular/ skips some  Is in peri menopause   Had some hot flashes for 2 days    Colon cancer screening  Colonoscopy 05/2022 with 10 y recall   Bone health   Falls- none  Fractures-none  Supplements  none  Tumeric for arthritis  Exercise -rowing / very good  Likes circuit training    Mood    03/21/2023    8:05 AM 03/15/2022   11:03 AM 11/22/2021    9:05 AM 03/01/2021    3:32 PM 02/26/2020    4:18 PM  Depression screen PHQ 2/9  Decreased Interest 0 0 0 0 0  Down, Depressed, Hopeless 0 0 0 0 0  PHQ - 2 Score 0 0 0 0 0  Altered sleeping 0 0  0 0  Tired, decreased energy 0 0  0 1  Change in appetite 0 0  0 0  Feeling bad or failure about yourself  0 0  0 0  Trouble concentrating 0 0  0 0  Moving slowly or fidgety/restless 0 0  0 0   Suicidal thoughts 0 0  0 0  PHQ-9 Score 0 0  0 1  Difficult doing work/chores Not difficult at all   Not difficult at all Not difficult at all    Cholesterol Lab Results  Component Value Date   CHOL 161 03/14/2023   CHOL 140 02/28/2022   CHOL 144 03/23/2021   Lab Results  Component Value Date   HDL 61.60 03/14/2023   HDL 48.80 02/28/2022   HDL 49.50 03/23/2021   Lab Results  Component Value Date   LDLCALC 82 03/14/2023   LDLCALC 75 02/28/2022   LDLCALC 81 03/23/2021   Lab Results  Component Value Date   TRIG 85.0 03/14/2023   TRIG 81.0 02/28/2022   TRIG 68.0 03/23/2021   Lab Results  Component Value Date   CHOLHDL 3 03/14/2023   CHOLHDL 3 02/28/2022   CHOLHDL 3 03/23/2021   No results found for: "LDLDIRECT" Good cholesterol   Other labs Lab Results  Component Value Date   NA 138 03/14/2023   K 4.1 03/14/2023   CO2 25 03/14/2023   GLUCOSE 96 03/14/2023   BUN 12 03/14/2023   CREATININE  0.87 03/14/2023   CALCIUM 9.0 03/14/2023   GFR 78.09 03/14/2023   GFRNONAA 82.32 04/20/2010   Lab Results  Component Value Date   WBC 4.4 03/14/2023   HGB 13.6 03/14/2023   HCT 41.4 03/14/2023   MCV 94.9 03/14/2023   PLT 291.0 03/14/2023   Lab Results  Component Value Date   ALT 12 03/14/2023   AST 14 03/14/2023   ALKPHOS 41 03/14/2023   BILITOT 0.5 03/14/2023   Lab Results  Component Value Date   TSH 2.24 03/14/2023       Patient Active Problem List   Diagnosis Date Noted   Colon cancer screening 03/01/2021   Encounter for screening mammogram for breast cancer 06/02/2014   Routine general medical examination at a health care facility 05/28/2013   Encounter for routine gynecological examination 05/28/2013   ALLERGIC RHINITIS 11/22/2007   TOBACCO USE, QUIT 11/21/2007   Past Medical History:  Diagnosis Date   Allergy    Arthritis    History of chicken pox    History of UTI    Past Surgical History:  Procedure Laterality Date   CHOLECYSTECTOMY   06/20/1993   FRACTURE SURGERY  2008   Clavicle   RESECTION DISTAL CLAVICAL  06/21/2007   Social History   Tobacco Use   Smoking status: Former    Current packs/day: 0.00    Average packs/day: 1 pack/day for 5.0 years (5.0 ttl pk-yrs)    Types: Cigarettes    Quit date: 12/19/2002    Years since quitting: 20.2   Smokeless tobacco: Never   Tobacco comments:    Smoked from ages [age] light, [age] heavy, [age] heavy  Vaping Use   Vaping status: Never Used  Substance Use Topics   Alcohol use: Yes    Comment: Alcohol consumption is currently  1 or 2 beers per month   Drug use: No   Family History  Problem Relation Age of Onset   Colon polyps Father    Skin cancer Father    High blood pressure Father    Cancer Father    Hypertension Father    Stroke Father    Depression Sister    ADD / ADHD Sister    Anxiety disorder Sister    Prostate cancer Maternal Uncle    Colon polyps Paternal Aunt    Colon cancer Paternal Aunt    Prostate cancer Maternal Grandfather    Cancer Maternal Grandfather    Skin cancer Paternal Grandmother    High blood pressure Paternal Grandmother    Cancer Paternal Grandmother    Skin cancer Paternal Grandfather    Arthritis Mother    Cancer Maternal Aunt    Cancer Maternal Uncle    Esophageal cancer Neg Hx    Rectal cancer Neg Hx    Stomach cancer Neg Hx    No Known Allergies No current outpatient medications on file prior to visit.   No current facility-administered medications on file prior to visit.    Review of Systems  Constitutional:  Negative for activity change, appetite change, fatigue, fever and unexpected weight change.  HENT:  Negative for congestion, ear pain, rhinorrhea, sinus pressure and sore throat.   Eyes:  Negative for pain, redness and visual disturbance.  Respiratory:  Negative for cough, shortness of breath and wheezing.   Cardiovascular:  Negative for chest pain and palpitations.  Gastrointestinal:  Negative for abdominal  pain, blood in stool, constipation and diarrhea.  Endocrine: Negative for polydipsia and polyuria.  Genitourinary:  Negative for dysuria, frequency and urgency.  Musculoskeletal:  Negative for arthralgias, back pain and myalgias.  Skin:  Negative for pallor and rash.  Allergic/Immunologic: Negative for environmental allergies.  Neurological:  Negative for dizziness, syncope and headaches.  Hematological:  Negative for adenopathy. Does not bruise/bleed easily.  Psychiatric/Behavioral:  Negative for decreased concentration and dysphoric mood. The patient is not nervous/anxious.        Objective:   Physical Exam Constitutional:      General: She is not in acute distress.    Appearance: Normal appearance. She is well-developed and normal weight. She is not ill-appearing or diaphoretic.  HENT:     Head: Normocephalic and atraumatic.     Right Ear: Tympanic membrane, ear canal and external ear normal.     Left Ear: Tympanic membrane, ear canal and external ear normal.     Nose: Nose normal. No congestion.     Mouth/Throat:     Mouth: Mucous membranes are moist.     Pharynx: Oropharynx is clear. No posterior oropharyngeal erythema.  Eyes:     General: No scleral icterus.    Extraocular Movements: Extraocular movements intact.     Conjunctiva/sclera: Conjunctivae normal.     Pupils: Pupils are equal, round, and reactive to light.  Neck:     Thyroid: No thyromegaly.     Vascular: No carotid bruit or JVD.  Cardiovascular:     Rate and Rhythm: Normal rate and regular rhythm.     Pulses: Normal pulses.     Heart sounds: Normal heart sounds.     No gallop.  Pulmonary:     Effort: Pulmonary effort is normal. No respiratory distress.     Breath sounds: Normal breath sounds. No wheezing.     Comments: Good air exch Chest:     Chest wall: No tenderness.  Abdominal:     General: Bowel sounds are normal. There is no distension or abdominal bruit.     Palpations: Abdomen is soft. There is  no mass.     Tenderness: There is no abdominal tenderness.     Hernia: No hernia is present.  Genitourinary:    Comments: Breast exam: No mass, nodules, thickening, tenderness, bulging, retraction, inflamation, nipple discharge or skin changes noted.  No axillary or clavicular LA.     Musculoskeletal:        General: No tenderness. Normal range of motion.     Cervical back: Normal range of motion and neck supple. No rigidity. No muscular tenderness.     Right lower leg: No edema.     Left lower leg: No edema.     Comments: No kyphosis   Lymphadenopathy:     Cervical: No cervical adenopathy.  Skin:    General: Skin is warm and dry.     Coloration: Skin is not pale.     Findings: No erythema or rash.     Comments: Few lentigines   Neurological:     Mental Status: She is alert. Mental status is at baseline.     Cranial Nerves: No cranial nerve deficit.     Motor: No abnormal muscle tone.     Coordination: Coordination normal.     Gait: Gait normal.     Deep Tendon Reflexes: Reflexes are normal and symmetric. Reflexes normal.  Psychiatric:        Mood and Affect: Mood normal.        Cognition and Memory: Cognition and memory normal.  Assessment & Plan:   Problem List Items Addressed This Visit       Other   Colon cancer screening    Colonoscopy 05/2022 with 10 y recall      Routine general medical examination at a health care facility - Primary    Reviewed health habits including diet and exercise and skin cancer prevention Reviewed appropriate screening tests for age  Also reviewed health mt list, fam hx and immunization status , as well as social and family history   See HPI Labs reviewed and ordered Commended weight loss/exercise/ self care  Flu shot today  Mammo 06/2022  Colonoscopy 05/2022 with 10 y recall Pap utd 2022   on OC / perimenopausal  Discussed fall prevention, supplements and exercise for bone density  PHQ 0

## 2023-03-21 NOTE — Patient Instructions (Addendum)
Try to get 1200-1500 mg of calcium per day with at least 2000 iu of vitamin D - for bone health (divide it in two)  If calcium constipates you just take the vitamin D alone    Keep exercising  Add some strength training to your routine, this is important for bone and brain health and can reduce your risk of falls and help your body use insulin properly and regulate weight  Light weights, exercise bands , and internet videos are a good way to start  Yoga (chair or regular), machines , floor exercises or a gym with machines are also good options    Take care of yourself   Flu shot today

## 2023-03-21 NOTE — Assessment & Plan Note (Signed)
Reviewed health habits including diet and exercise and skin cancer prevention Reviewed appropriate screening tests for age  Also reviewed health mt list, fam hx and immunization status , as well as social and family history   See HPI Labs reviewed and ordered Commended weight loss/exercise/ self care  Flu shot today  Mammo 06/2022  Colonoscopy 05/2022 with 10 y recall Pap utd 2022   on OC / perimenopausal  Discussed fall prevention, supplements and exercise for bone density  PHQ 0

## 2023-03-21 NOTE — Assessment & Plan Note (Signed)
Colonoscopy 05/2022 with 10 y recall

## 2023-04-11 ENCOUNTER — Other Ambulatory Visit: Payer: Self-pay | Admitting: Family Medicine

## 2023-07-02 ENCOUNTER — Other Ambulatory Visit: Payer: Self-pay | Admitting: Family Medicine

## 2023-07-14 ENCOUNTER — Other Ambulatory Visit: Payer: Self-pay | Admitting: Family Medicine

## 2023-07-14 DIAGNOSIS — Z1231 Encounter for screening mammogram for malignant neoplasm of breast: Secondary | ICD-10-CM

## 2023-08-02 ENCOUNTER — Ambulatory Visit
Admission: RE | Admit: 2023-08-02 | Discharge: 2023-08-02 | Disposition: A | Discharge status: Home / Self Care | Payer: 59 | Source: Ambulatory Visit | Attending: Family Medicine | Admitting: Family Medicine

## 2023-08-02 DIAGNOSIS — Z1231 Encounter for screening mammogram for malignant neoplasm of breast: Secondary | ICD-10-CM

## 2023-08-06 ENCOUNTER — Encounter: Payer: Self-pay | Admitting: Family Medicine

## 2024-03-05 ENCOUNTER — Encounter: Payer: Self-pay | Admitting: Family Medicine

## 2024-03-05 DIAGNOSIS — Z23 Encounter for immunization: Secondary | ICD-10-CM

## 2024-03-05 MED ORDER — COVID-19 MRNA VAC-TRIS(PFIZER) 30 MCG/0.3ML IM SUSY: 0.3000 mL | PREFILLED_SYRINGE | Freq: Once | INTRAMUSCULAR | 0 refills | Status: AC

## 2024-03-05 NOTE — Telephone Encounter (Signed)
 Will route message and Rx to PCP

## 2024-03-17 ENCOUNTER — Telehealth: Payer: Self-pay | Admitting: Family Medicine

## 2024-03-17 DIAGNOSIS — Z0000 Encounter for general adult medical examination without abnormal findings: Secondary | ICD-10-CM

## 2024-03-17 NOTE — Telephone Encounter (Signed)
-----   Message from Veva JINNY Ferrari sent at 03/04/2024  3:42 PM EDT ----- Regarding: Lab orders for Tue, 5.30.25 Patient is scheduled for CPX labs, please order future labs, Thanks , Veva

## 2024-03-19 ENCOUNTER — Other Ambulatory Visit (INDEPENDENT_AMBULATORY_CARE_PROVIDER_SITE_OTHER): Payer: 59

## 2024-03-19 ENCOUNTER — Ambulatory Visit: Payer: Self-pay | Admitting: Family Medicine

## 2024-03-19 DIAGNOSIS — Z0000 Encounter for general adult medical examination without abnormal findings: Secondary | ICD-10-CM

## 2024-03-19 LAB — COMPREHENSIVE METABOLIC PANEL WITH GFR
ALT: 19 U/L (ref 0–35)
AST: 24 U/L (ref 0–37)
Albumin: 4.2 g/dL (ref 3.5–5.2)
Alkaline Phosphatase: 37 U/L — ABNORMAL LOW (ref 39–117)
BUN: 13 mg/dL (ref 6–23)
CO2: 27 meq/L (ref 19–32)
Calcium: 9.2 mg/dL (ref 8.4–10.5)
Chloride: 106 meq/L (ref 96–112)
Creatinine, Ser: 0.83 mg/dL (ref 0.40–1.20)
GFR: 82.04 mL/min (ref >60.00)
Glucose, Bld: 101 mg/dL — ABNORMAL HIGH (ref 70–99)
Potassium: 4.2 meq/L (ref 3.5–5.1)
Sodium: 139 meq/L (ref 135–145)
Total Bilirubin: 0.4 mg/dL (ref 0.2–1.2)
Total Protein: 6.9 g/dL (ref 6.0–8.3)

## 2024-03-19 LAB — CBC WITH DIFFERENTIAL/PLATELET
Basophils Absolute: 0.1 K/uL (ref 0.0–0.1)
Basophils Relative: 2.5 % (ref 0.0–3.0)
Eosinophils Absolute: 0.4 K/uL (ref 0.0–0.7)
Eosinophils Relative: 8.7 % — ABNORMAL HIGH (ref 0.0–5.0)
HCT: 40.6 % (ref 36.0–46.0)
Hemoglobin: 13.6 g/dL (ref 12.0–15.0)
Lymphocytes Relative: 39.4 % (ref 12.0–46.0)
Lymphs Abs: 1.7 K/uL (ref 0.7–4.0)
MCHC: 33.5 g/dL (ref 30.0–36.0)
MCV: 91.9 fl (ref 78.0–100.0)
Monocytes Absolute: 0.5 K/uL (ref 0.1–1.0)
Monocytes Relative: 11.7 % (ref 3.0–12.0)
Neutro Abs: 1.6 K/uL (ref 1.4–7.7)
Neutrophils Relative %: 37.7 % — ABNORMAL LOW (ref 43.0–77.0)
Platelets: 277 K/uL (ref 150.0–400.0)
RBC: 4.43 Mil/uL (ref 3.87–5.11)
RDW: 12.4 % (ref 11.5–15.5)
WBC: 4.3 K/uL (ref 4.0–10.5)

## 2024-03-19 LAB — LIPID PANEL
Cholesterol: 141 mg/dL (ref 0–200)
HDL: 58.1 mg/dL (ref >39.00)
LDL Cholesterol: 70 mg/dL (ref 0–99)
NonHDL: 82.97
Total CHOL/HDL Ratio: 2
Triglycerides: 66 mg/dL (ref 0.0–149.0)
VLDL: 13.2 mg/dL (ref 0.0–40.0)

## 2024-03-19 LAB — TSH: TSH: 1.65 u[IU]/mL (ref 0.35–5.50)

## 2024-03-26 ENCOUNTER — Ambulatory Visit (INDEPENDENT_AMBULATORY_CARE_PROVIDER_SITE_OTHER): Payer: 59 | Admitting: Family Medicine

## 2024-03-26 ENCOUNTER — Encounter: Payer: Self-pay | Admitting: Family Medicine

## 2024-03-26 VITALS — BP 106/72 | HR 57 | Temp 98.3°F | Ht 69.5 in | Wt 191.0 lb

## 2024-03-26 DIAGNOSIS — Z23 Encounter for immunization: Secondary | ICD-10-CM

## 2024-03-26 DIAGNOSIS — Z1211 Encounter for screening for malignant neoplasm of colon: Secondary | ICD-10-CM

## 2024-03-26 DIAGNOSIS — Z0000 Encounter for general adult medical examination without abnormal findings: Secondary | ICD-10-CM

## 2024-03-26 MED ORDER — LEVONORGESTREL-ETHINYL ESTRAD 0.1-20 MG-MCG PO TABS: 1.0000 | ORAL_TABLET | Freq: Every day | ORAL | 3 refills | Status: AC

## 2024-03-26 NOTE — Progress Notes (Signed)
 Subjective:    Patient ID: Angela Gates, female    DOB: 10/03/1975, 48 y.o.   MRN: [id number]  HPI  Here for health maintenance exam and to review chronic medical problems   Wt Readings from Last 3 Encounters:  03/26/24 191 lb (86.6 kg)  03/21/23 172 lb 8 oz (78.2 kg)  06/07/22 215 lb (97.5 kg)   27.80 kg/m  Vitals:   03/26/24 0833  BP: 106/72  Pulse: (!) 57  Temp: 98.3 F (36.8 C)  SpO2: 98%    Immunization History  Administered Date(s) Administered   Influenza, Seasonal, Injecte, Preservative Fre 03/21/2023, 03/26/2024   Influenza,inj,Quad PF,6+ Mos 05/28/2013, 06/02/2014, 02/20/2019, 02/26/2020, 03/01/2021, 03/15/2022   PFIZER(Purple Top)SARS-COV-2 Vaccination 09/18/2019, 10/16/2019   Td 06/20/1994, 03/04/2008, 01/13/2009, 02/26/2020    Health Maintenance Due  Topic Date Due   HIV Screening  Never done   Hepatitis B Vaccines 19-59 Average Risk (1 of 3 - 19+ 3-dose series) Never done   Pneumococcal Vaccine: 50+ Years (1 of 1 - PCV) Never done   Doing well Lots of craft shows and full time job   Weight went back up a bit when she had to cut back exercise  Flu shot -today Got her covid vaccine   Shingrix -interested   Mammogram 07/2023 Self breast exam-no lumps   Gyn health Pap 02/2021 No symptoms   Period is irregular  Past 3 month regularly  No hot flashes or menopausal symptoms   Colon cancer screening  Colonoscopy 05/2022  10y recall  Bone health   Falls- none  Fractures-none  Supplements -ca and D  Exercise  Rowing  Active job  Wants to add more strength training    Mood    03/26/2024    8:39 AM 03/21/2023    8:05 AM 03/15/2022   11:03 AM 11/22/2021    9:05 AM 03/01/2021    3:32 PM  Depression screen PHQ 2/9  Decreased Interest 0 0 0 0 0  Down, Depressed, Hopeless 0 0 0 0 0  PHQ - 2 Score 0 0 0 0 0  Altered sleeping 0 0 0  0  Tired, decreased energy 0 0 0  0  Change in appetite 0 0 0  0  Feeling bad or failure about yourself   0 0 0  0  Trouble concentrating 0 0 0  0  Moving slowly or fidgety/restless 0 0 0  0  Suicidal thoughts 0 0 0  0  PHQ-9 Score 0 0 0  0  Difficult doing work/chores Not difficult at all Not difficult at all   Not difficult at all    Cholesterol Lab Results  Component Value Date   CHOL 141 03/19/2024   CHOL 161 03/14/2023   CHOL 140 02/28/2022   Lab Results  Component Value Date   HDL 58.10 03/19/2024   HDL 61.60 03/14/2023   HDL 48.80 02/28/2022   Lab Results  Component Value Date   LDLCALC 70 03/19/2024   LDLCALC 82 03/14/2023   LDLCALC 75 02/28/2022   Lab Results  Component Value Date   TRIG 66.0 03/19/2024   TRIG 85.0 03/14/2023   TRIG 81.0 02/28/2022   Lab Results  Component Value Date   CHOLHDL 2 03/19/2024   CHOLHDL 3 03/14/2023   CHOLHDL 3 02/28/2022   No results found for: LDLDIRECT  Eats very healthy   Lab Results  Component Value Date   NA 139 03/19/2024   K 4.2 03/19/2024   CO2 27  03/19/2024   GLUCOSE 101 (H) 03/19/2024   BUN 13 03/19/2024   CREATININE 0.83 03/19/2024   CALCIUM 9.2 03/19/2024   GFR 82.04 03/19/2024   GFRNONAA 82.32 04/20/2010   Lab Results  Component Value Date   ALT 19 03/19/2024   AST 24 03/19/2024   ALKPHOS 37 (L) 03/19/2024   BILITOT 0.4 03/19/2024   Almost no etoh   Lab Results  Component Value Date   WBC 4.3 03/19/2024   HGB 13.6 03/19/2024   HCT 40.6 03/19/2024   MCV 91.9 03/19/2024   PLT 277.0 03/19/2024   Lab Results  Component Value Date   TSH 1.65 03/19/2024     Patient Active Problem List   Diagnosis Date Noted   Colon cancer screening 03/01/2021   Encounter for screening mammogram for breast cancer 06/02/2014   Routine general medical examination at a health care facility 05/28/2013   Encounter for routine gynecological examination 05/28/2013   Allergic rhinitis 11/22/2007   TOBACCO USE, QUIT 11/21/2007   Past Medical History:  Diagnosis Date   Allergy    Arthritis    History of chicken  pox    History of UTI    Past Surgical History:  Procedure Laterality Date   CHOLECYSTECTOMY  06/20/1993   EYE SURGERY  2018   Lasik   FRACTURE SURGERY  2008   Clavicle   RESECTION DISTAL CLAVICAL  06/21/2007   Social History   Tobacco Use   Smoking status: Former    Current packs/day: 0.00    Average packs/day: 1 pack/day for 5.0 years (5.0 ttl pk-yrs)    Types: Cigarettes    Quit date: 12/19/2002    Years since quitting: 21.2   Smokeless tobacco: Never   Tobacco comments:    Smoked from ages [age] light, [age] heavy, [age] heavy  Vaping Use   Vaping status: Never Used  Substance Use Topics   Alcohol use: Yes    Comment: Alcohol consumption is currently  1 or 2 beers per month   Drug use: No   Family History  Problem Relation Age of Onset   Colon polyps Father    Skin cancer Father    High blood pressure Father    Cancer Father    Hypertension Father    Stroke Father    Depression Sister    ADD / ADHD Sister    Anxiety disorder Sister    Prostate cancer Maternal Uncle    Colon polyps Paternal Aunt    Colon cancer Paternal Aunt    Prostate cancer Maternal Grandfather    Cancer Maternal Grandfather    Skin cancer Paternal Grandmother    High blood pressure Paternal Grandmother    Cancer Paternal Grandmother    Skin cancer Paternal Grandfather    Arthritis Mother    Cancer Maternal Aunt    Cancer Maternal Uncle    Esophageal cancer Neg Hx    Rectal cancer Neg Hx    Stomach cancer Neg Hx    No Known Allergies No current outpatient medications on file prior to visit.   No current facility-administered medications on file prior to visit.    Review of Systems  Constitutional:  Negative for activity change, appetite change, fatigue, fever and unexpected weight change.  HENT:  Negative for congestion, ear pain, rhinorrhea, sinus pressure and sore throat.   Eyes:  Negative for pain, redness and visual disturbance.  Respiratory:  Negative for cough, shortness  of breath and wheezing.   Cardiovascular:  Negative  for chest pain and palpitations.  Gastrointestinal:  Negative for abdominal pain, blood in stool, constipation and diarrhea.  Endocrine: Negative for polydipsia and polyuria.  Genitourinary:  Negative for dysuria, frequency and urgency.  Musculoskeletal:  Negative for arthralgias, back pain and myalgias.  Skin:  Negative for pallor and rash.  Allergic/Immunologic: Negative for environmental allergies.  Neurological:  Negative for dizziness, syncope and headaches.  Hematological:  Negative for adenopathy. Does not bruise/bleed easily.  Psychiatric/Behavioral:  Negative for decreased concentration and dysphoric mood. The patient is not nervous/anxious.        Objective:   Physical Exam Constitutional:      General: She is not in acute distress.    Appearance: Normal appearance. She is well-developed and normal weight. She is not ill-appearing or diaphoretic.  HENT:     Head: Normocephalic and atraumatic.     Right Ear: Tympanic membrane, ear canal and external ear normal.     Left Ear: Tympanic membrane, ear canal and external ear normal.     Nose: Nose normal. No congestion.     Mouth/Throat:     Mouth: Mucous membranes are moist.     Pharynx: Oropharynx is clear. No posterior oropharyngeal erythema.  Eyes:     General: No scleral icterus.    Extraocular Movements: Extraocular movements intact.     Conjunctiva/sclera: Conjunctivae normal.     Pupils: Pupils are equal, round, and reactive to light.  Neck:     Thyroid : No thyromegaly.     Vascular: No carotid bruit or JVD.  Cardiovascular:     Rate and Rhythm: Normal rate and regular rhythm.     Pulses: Normal pulses.     Heart sounds: Normal heart sounds.     No gallop.  Pulmonary:     Effort: Pulmonary effort is normal. No respiratory distress.     Breath sounds: Normal breath sounds. No wheezing.     Comments: Good air exch Chest:     Chest wall: No tenderness.   Abdominal:     General: Bowel sounds are normal. There is no distension or abdominal bruit.     Palpations: Abdomen is soft. There is no mass.     Tenderness: There is no abdominal tenderness.     Hernia: No hernia is present.  Genitourinary:    Comments: Breast exam: No mass, nodules, thickening, tenderness, bulging, retraction, inflamation, nipple discharge or skin changes noted.  No axillary or clavicular LA.     Musculoskeletal:        General: No tenderness. Normal range of motion.     Cervical back: Normal range of motion and neck supple. No rigidity. No muscular tenderness.     Right lower leg: No edema.     Left lower leg: No edema.     Comments: No kyphosis   Lymphadenopathy:     Cervical: No cervical adenopathy.  Skin:    General: Skin is warm and dry.     Coloration: Skin is not pale.     Findings: No erythema or rash.     Comments: Tanned Solar lentigines diffusely    Neurological:     Mental Status: She is alert. Mental status is at baseline.     Cranial Nerves: No cranial nerve deficit.     Motor: No abnormal muscle tone.     Coordination: Coordination normal.     Gait: Gait normal.     Deep Tendon Reflexes: Reflexes are normal and symmetric. Reflexes normal.  Psychiatric:  Mood and Affect: Mood normal.        Cognition and Memory: Cognition and memory normal.           Assessment & Plan:   Problem List Items Addressed This Visit       Other   Routine general medical examination at a health care facility - Primary   Reviewed health habits including diet and exercise and skin cancer prevention Reviewed appropriate screening tests for age  Also reviewed health mt list, fam hx and immunization status , as well as social and family history   See HPI Labs reviewed and ordered Health Maintenance  Topic Date Due   HIV Screening  Never done   Hepatitis B Vaccine (1 of 3 - 19+ 3-dose series) Never done   Pneumococcal Vaccine for age over 52 (1 of 1  - PCV) Never done   Flu Shot  01/19/2024   Zoster (Shingles) Vaccine (1 of 2) 06/26/2025*   COVID-19 Vaccine (3 - 2025-26 season) 04/11/2026*   Breast Cancer Screening  08/01/2024   Pap with HPV screening  03/01/2026   DTaP/Tdap/Td vaccine (5 - Tdap) 02/25/2030   Colon Cancer Screening  06/07/2032   Hepatitis C Screening  Completed   HPV Vaccine  Aged Out   Meningitis B Vaccine  Aged Out  *Topic was postponed. The date shown is not the original due date.    Had covid vaccine Flu vaccine today  Interested in shingrix, will find out where it is covered  Perimanopausal , continues OC, recent menses regular  No falls or fractures Discussed fall prevention, supplements and exercise for bone density  PHQ 0 Labs reviewed  Encouraged more strength building exercise        Colon cancer screening   Colonoscopy 05/2022 with 10 y recall      Other Visit Diagnoses       Need for influenza vaccination       Relevant Orders   Flu vaccine trivalent PF, 6mos and older(Flulaval,Afluria,Fluarix,Fluzone) (Completed)

## 2024-03-26 NOTE — Assessment & Plan Note (Addendum)
 Reviewed health habits including diet and exercise and skin cancer prevention Reviewed appropriate screening tests for age  Also reviewed health mt list, fam hx and immunization status , as well as social and family history   See HPI Labs reviewed and ordered Health Maintenance  Topic Date Due   HIV Screening  Never done   Hepatitis B Vaccine (1 of 3 - 19+ 3-dose series) Never done   Pneumococcal Vaccine for age over 38 (1 of 1 - PCV) Never done   Flu Shot  01/19/2024   Zoster (Shingles) Vaccine (1 of 2) 06/26/2025*   COVID-19 Vaccine (3 - 2025-26 season) 04/11/2026*   Breast Cancer Screening  08/01/2024   Pap with HPV screening  03/01/2026   DTaP/Tdap/Td vaccine (5 - Tdap) 02/25/2030   Colon Cancer Screening  06/07/2032   Hepatitis C Screening  Completed   HPV Vaccine  Aged Out   Meningitis B Vaccine  Aged Out  *Topic was postponed. The date shown is not the original due date.    Had covid vaccine Flu vaccine today  Interested in shingrix, will find out where it is covered  Perimanopausal , continues OC, recent menses regular  No falls or fractures Discussed fall prevention, supplements and exercise for bone density  PHQ 0 Labs reviewed  Encouraged more strength building exercise

## 2024-03-26 NOTE — Assessment & Plan Note (Signed)
Colonoscopy 05/2022 with 10 y recall

## 2024-03-26 NOTE — Patient Instructions (Addendum)
 If you are interested in the shingles vaccine series (Shingrix), call your insurance or pharmacy to check on coverage and location it must be given.  If affordable - you can schedule it here or at your pharmacy depending on coverage   Fit in strength training whenever you can  Add some strength training to your routine, this is important for bone and brain health and can reduce your risk of falls and help your body use insulin properly and regulate weight  Light weights, exercise bands , and internet videos are a good way to start  Yoga (chair or regular), machines , floor exercises or a gym with machines are also good options    Flu shot today

## 2024-07-04 ENCOUNTER — Other Ambulatory Visit: Payer: Self-pay | Admitting: Family Medicine

## 2024-07-04 DIAGNOSIS — Z1231 Encounter for screening mammogram for malignant neoplasm of breast: Secondary | ICD-10-CM

## 2024-08-07 ENCOUNTER — Ambulatory Visit

## 2025-03-25 ENCOUNTER — Other Ambulatory Visit

## 2025-04-01 ENCOUNTER — Encounter: Admitting: Family Medicine
